# Patient Record
Sex: Female | Born: 1985 | Race: White | Hispanic: No | Marital: Married | Smoking: Never smoker
Health system: Southern US, Community
[De-identification: ages and names within clinical notes are randomized; demographics above are authoritative.]

## PROBLEM LIST (undated history)

## (undated) HISTORY — PX: TUBAL LIGATION: SHX77

## (undated) HISTORY — PX: LUNG TRANSPLANT: SHX234

---

## 2010-02-19 DIAGNOSIS — K219 Gastro-esophageal reflux disease without esophagitis: Secondary | ICD-10-CM | POA: Insufficient documentation

## 2011-05-29 DIAGNOSIS — E119 Type 2 diabetes mellitus without complications: Secondary | ICD-10-CM | POA: Insufficient documentation

## 2012-07-03 DIAGNOSIS — Z8719 Personal history of other diseases of the digestive system: Secondary | ICD-10-CM | POA: Insufficient documentation

## 2012-07-18 DIAGNOSIS — Z942 Lung transplant status: Secondary | ICD-10-CM | POA: Insufficient documentation

## 2012-07-25 DIAGNOSIS — F419 Anxiety disorder, unspecified: Secondary | ICD-10-CM | POA: Insufficient documentation

## 2012-08-18 DIAGNOSIS — E559 Vitamin D deficiency, unspecified: Secondary | ICD-10-CM | POA: Insufficient documentation

## 2015-03-30 DIAGNOSIS — M818 Other osteoporosis without current pathological fracture: Secondary | ICD-10-CM | POA: Insufficient documentation

## 2016-04-07 LAB — RAPID STREP TEST: Rapid Strep screen: NEGATIVE

## 2016-04-07 LAB — INFLUENZA SCREEN BY PCR
Influenza A by PCR: POSITIVE — AB
Influenza B by PCR: NEGATIVE

## 2016-04-07 NOTE — Other (Signed)
[facility]    [place], [place]                         REPORT TYPE:   Urgent Care Provider Report             REPORT STATUS: Signed    ________________________________________________________________________        PATIENT NAME:      Crystal Davidson,Crystal Davidson   PATIENT ID #:   [id number]    BIRTHDATE:           Mar 23, 1985   ACCOUNT #:     [id number]    AGE:           31      REG DATE:       04/07/16      GENDER:           F          LOC:          [facility]   Urgent Care Arrival: 04/07/16  0950h    ________________________________________________________________________        Lysle DingwallINTERESTED PARTIES:       Location:  [facility]               ATTENDING PHYSICIAN: Bunnie PionNEPTUNE,Martavion Couper APRN, FNP          ADDITIONAL COPIES:  Peterson AoSKELTON,SARAH K MD;SKELTON,SARAH K MD                                     ________________________________________________________________________    DICTABunnie Pion:  Torres Hardenbrook APRN, FNP        History of Present Illness    Date of Service:  Apr 07, 2016    Chief Complaint    sore throat, cough, fever    Travelled outside the [place]:  No    Patient displaying symptoms:  No    31 year old female [facility] with cough, fever, chills, sore throat   starting yesterday. She has history of CF with bilateral lung transplants in [facility] at [facility]. She   called transplant team this morning to discuss symptoms and was advised to present to urgent care   for influenza testing, strep screen, and CXR as she is immunosuppressed. Approx 1 week ago she   completed 3 week course of Levaquin for sinusitis, symptoms have improved. Highest fever was last   night at 101.6, cough is productive with green sputum. She denies ear pain, nausea, vomiting,   diarrhea. Denies headache. Admits to muscle aches, she also reports they have been present since   the end of her Levaquin course, improving. Denies chest pain or shortness of breath. Tylenol last   night for fever.        Review of Systems     Constitutional:  chills, fatigue, fever    Eyes:  no symptoms reported    Ears, Nose, Throat, Mouth:  no nose symptoms reported, sore throat, painful swallowing,  denies:   ear pain, facial pain    Respiratory:  cough, sputum    Cardiovascular:  denies: lightheadedness    Gastrointestinal:  denies: diarrhea, nausea, vomiting    Genitourinary:  no symptoms reported    Musculoskeletal:  muscle pain    Integumentary:  denies: rash    Neurologic:  denies: headache    ALLERGIES:      Coded Allergies:  cephapirin (Unverified  Allergy, Mild, HIVES, 04/07/16)    ------------------------------         HOME MEDICATIONS     Active     Reported    Sulfamethoxazole-Tmp Ss Tab (Sulfamethoxazole/Trimethoprim) 1 Each Tablet 1 Each PO     Prednisone 5 Mg Tablet 5 Mg PO     Aspirin 81 Mg Tablet.dr 81 Mg PO     Ibandronate Sodium 150 Mg Tablet 1 Tab PO MONTHLY         Take one tablet monthly    Sertraline Hcl 50 Mg Tablet 50 Mg PO DAILY    Folic Acid 1 Mg Tablet 1 Mg PO DAILY    Cellcept (Mycophenolate Mofetil) 250 Mg Capsule 250 Mg PO     Prograf (Tacrolimus Anhydrous) 1 Mg Capsule 1 Mg PO     Astagraf Xl (Tacrolimus) 5 Mg Cap.er.24h 5 Mg PO     Zenpep Dr 20,000 Units Capsule (Amylase/Lipase/Protease) 1 Each Capsule.dr 1 Each PO     Pantoprazole Sodium 40 Mg Tablet.dr 40 Mg PO DAILY    Levofloxacin 750 Mg Tablet 750 Mg PO     C[place] (Aztreonam Lysine) 75 Mg/1 Ml Vial.neb 75 Mg IH             Past Medical History    Medical History:  other (Cystic Fibrosis)    Surgical History:  other (bilateral lung transplant)        Physical Exam    VITAL SIGNS         Vital Signs              Date Time  Temp Pulse Resp B/P Pulse Ox O2 Delivery O2 Flow Rate FiO2         04/07/16 10:20 37.5 89 16 121/81 99 Room Air              Nursing Documentation Vitals:  reviewed by me    *    GENERAL: well-developed, well-nourished, in no distress    HEAD: atraumatic, normocephalic, non-tender frontal and maxillary sinuses on percussion     EYES: PERRLA, no eye discharge or injection    EARS: bilateral tympanic membranes pearly gray, non-bulging, normal light reflex    NOSE: nasal turbinates non-swollen, moist without redness or discharge    OROPHARYNX: buccal mucosa moist, oropharynx clear; posterior pharynx injected; no tonsillar swelling  , injection or exudates    NECK: supple, anterior cervical adenopathy, full ROM, non-tender, no stridor    RESPIRATORY: Inspiratory wheezes in left mid - upper. clear to auscultation over right. No rales or   rhonchi bilaterally; no accessory retraction or nasal flaring    CARDIOVASCULAR: regular rate. No leg edema    MS: Full ROM of shoulders, no weakness. Steady strong gait.     NEURO: CN II - CN XII grossly intact.        Labs/Rads/ECGs    Laboratory Results         Laboratory Tests            Test 04/07/16         10:50         Group A Streptococcus Rapid Negative                 Course    Medical Decision Making    Vital signs stable, patient nontoxic-appearing well-hydrated. Hx of CF with bilateral lung   transplant. She spoke with her transplant team at [facility] this morning and was advised to  be seen at   [facility] for CXR and Influenza testing. Patient with viral upper respiratory tract symptoms, no acute   process seen on CXR. Rapid Strep Screen negative, culture sent. Testing for influenza, will call   patient with positive or negative results, so she may notify her transplant team.      Patient without signs of bacterial sinusitis, otitis media, pharyngitis, or pneumonia on   examination. Inspiratory wheezing heard on left upon auscultation, discussed option for albuterol   inhaler with patient, she declined rx at this time as her transplant team is able to prescribe, she   will contact them for further management. She will follow up with PCP for muscle pain, specifically   right shoulder. Magic mouthwash rx for throat discomfort. Symptomatic care was reviewed. Reasons to    seek follow-up care including reasons to go to the ER were reviewed. The patient voiced their   understanding and agreement with this plan.        Discharge Plan    Clinical Impression:       Primary Impression:       Upper respiratory infection     Qualified Code:  J06.9 - Acute upper respiratory infection, unspecified    Referrals:      Dorthula Nettles MD (PCP)            Bunnie Pion APRN, FNP Apr 07, 2016 10:30        Electronically Signed By:   Janett Billow, FNP at 04/07/16 1302        Disclaimer: Portions of this document may contain text elements generated through computerized   voice recognition. Undetected computer generated word errors are possible. Please notify the   signing provider or return the document to [facility] Records department if clarification or   correction is needed.

## 2016-04-09 LAB — CULTURE, STREP THROAT: THROAT SCREEN BY CULTURE: NEGATIVE

## 2016-04-15 DIAGNOSIS — N182 Chronic kidney disease, stage 2 (mild): Secondary | ICD-10-CM | POA: Insufficient documentation

## 2016-06-04 ENCOUNTER — Encounter

## 2016-09-17 DIAGNOSIS — T86810 Lung transplant rejection: Secondary | ICD-10-CM | POA: Insufficient documentation

## 2016-11-27 ENCOUNTER — Emergency Department: Admit: 2016-11-27 | Payer: PRIVATE HEALTH INSURANCE | Primary: Family Medicine

## 2016-11-27 ENCOUNTER — Inpatient Hospital Stay
Admit: 2016-11-27 | Discharge: 2016-11-27 | Disposition: A | Discharge status: Home / Self Care | Payer: PRIVATE HEALTH INSURANCE | Attending: Emergency Medicine

## 2016-11-27 ENCOUNTER — Inpatient Hospital Stay: Admit: 2016-11-27 | Payer: MEDICARE | Attending: Physician Assistant | Primary: Family Medicine

## 2016-11-27 ENCOUNTER — Inpatient Hospital Stay
Admit: 2016-11-27 | Discharge: 2016-11-27 | Disposition: A | Discharge status: Other Acute Inpatient Hospital | Payer: MEDICARE | Attending: Physician Assistant

## 2016-11-27 DIAGNOSIS — J22 Unspecified acute lower respiratory infection: Secondary | ICD-10-CM

## 2016-11-27 DIAGNOSIS — R079 Chest pain, unspecified: Secondary | ICD-10-CM

## 2016-11-27 LAB — CBC WITH AUTOMATED DIFF
ABS. BASOPHILS: 0 10*3/uL (ref 0.00–0.08)
ABS. EOSINOPHILS: 0 10*3/uL (ref 0.0–0.4)
ABS. IMM. GRANS.: 0 10*3/uL (ref 0.00–0.04)
ABS. LYMPHOCYTES: 0.7 10*3/uL — ABNORMAL LOW (ref 1.2–3.7)
ABS. MONOCYTES: 0.6 10*3/uL (ref 0.2–1.0)
ABS. NEUTROPHILS: 9 10*3/uL — ABNORMAL HIGH (ref 1.6–6.1)
BASOPHILS: 0 % (ref 0–2)
EOSINOPHILS: 0 % (ref 0–5)
HCT: 34.3 % — ABNORMAL LOW (ref 37.0–47.0)
HGB: 11.5 g/dL — ABNORMAL LOW (ref 12.0–16.0)
IMMATURE GRANULOCYTES: 0 % (ref 0.0–0.6)
LYMPHOCYTES: 7 % — ABNORMAL LOW (ref 14–46)
MCH: 32.1 PG — ABNORMAL HIGH (ref 27.0–31.0)
MCHC: 33.5 g/dL (ref 33.0–37.0)
MCV: 95.8 FL (ref 81.0–99.0)
MONOCYTES: 6 % (ref 5–12)
MPV: 9.4 FL (ref 7.4–10.4)
NEUTROPHILS: 87 % — ABNORMAL HIGH (ref 47–80)
PLATELET: 286 10*3/uL (ref 130–400)
RBC: 3.58 M/uL — ABNORMAL LOW (ref 4.20–5.40)
RDW: 13.1 % (ref 11.5–14.5)
WBC: 10.3 10*3/uL (ref 4.5–10.9)

## 2016-11-27 LAB — METABOLIC PANEL, COMPREHENSIVE
ALT (SGPT): 18 U/L (ref 12–78)
AST (SGOT): 10 U/L (ref 10–37)
Albumin: 3.8 g/dL (ref 3.4–5.0)
Alk. phosphatase: 82 U/L (ref 43–117)
BUN: 20 MG/DL (ref 7–22)
Bilirubin, total: 0.8 mg/dL (ref 0.00–1.00)
CO2: 25 mmol/L (ref 21–32)
Calcium: 8.9 MG/DL (ref 8.5–10.1)
Chloride: 106 mmol/L (ref 98–108)
Creatinine: 0.88 MG/DL (ref 0.55–1.10)
GFR est AA: >60 mL/min/{1.73_m2} (ref >60)
GFR est non-AA: >60 mL/min/{1.73_m2} (ref >60)
Glucose: 130 mg/dL — ABNORMAL HIGH (ref 74–106)
Potassium: 4 mmol/L (ref 3.4–5.1)
Protein, total: 7.6 g/dL (ref 6.4–8.2)
Sodium: 139 mmol/L (ref 136–145)

## 2016-11-27 LAB — RESPIRATORY SYNCYTIAL VIRUS (RSV), PCR: RSV by PCR: NEGATIVE

## 2016-11-27 LAB — INFLUENZA A/B BY PCR
Influenza A by PCR: NEGATIVE
Influenza B by PCR: NEGATIVE

## 2016-11-27 MED ORDER — SODIUM CHLORIDE 0.9% BOLUS IV
0.9 % | Freq: Once | INTRAVENOUS | Status: DC
Start: 2016-11-27 — End: 2016-11-27

## 2016-11-27 MED ORDER — KETOROLAC TROMETHAMINE 30 MG/ML INJECTION
30 mg/mL (1 mL) | INTRAMUSCULAR | Status: AC
Start: 2016-11-27 — End: 2016-11-27
  Administered 2016-11-27: 19:00:00 via INTRAVENOUS

## 2016-11-27 MED ORDER — SODIUM CHLORIDE 0.9 % IV
1 gram | INTRAVENOUS | Status: AC
Start: 2016-11-27 — End: 2016-11-27
  Administered 2016-11-27: 20:00:00 via INTRAVENOUS

## 2016-11-27 MED FILL — SODIUM CHLORIDE 0.9 % IV: INTRAVENOUS | Qty: 1000

## 2016-11-27 MED FILL — MEROPENEM 1 GRAM IV SOLR: 1 gram | INTRAVENOUS | Qty: 2

## 2016-11-27 MED FILL — KETOROLAC TROMETHAMINE 30 MG/ML INJECTION: 30 mg/mL (1 mL) | INTRAMUSCULAR | Qty: 1

## 2016-11-27 NOTE — ED Triage Notes (Signed)
Pt sent from UC for further eval of SOB, and rt lower "lung pain". Pt is double lung transplant pt. States she [facility] last [facility], and has had pain and SOB since. Reports "minor cold symptoms" x 5 days, non prod cough and nasal congestion. Denies fevers/chills

## 2016-11-27 NOTE — Other (Signed)
This is a 31 year old woman with history of cystic fibrosis and doubled transplant four years ago here with dry cough for 5 days and acute right-sided sharp chest pain that started late last night.   She is being breathing in a shallow manner due to the chest pain however no tightness.  She reports this feels like when she has had pneumonia in the past.  She has been feeling well otherwise, has a mild runny nose but no fevers, no sore throat or other URI symptoms. She recently saw her lung transplant physician Dr. Sherren Mocha at [facility] in [facility], traveled by plane.  She is not on hormonal therapies, her tubes are tied however did have a blood clot when she was pregnant in the past felt to be secondary to pregnancy.  She took a couple Tylenol this morning with slightly took off the edge of the chest pain.             Past Medical History:   Diagnosis Date   ??? CF (cystic fibrosis) ([place])         Past Surgical History:   Procedure Laterality Date   ??? HX LUNG TRANSPLANT      Bilateral/double         History reviewed. No pertinent family history.     Social History     Socioeconomic History   ??? Marital status: MARRIED     Spouse name: Not on file   ??? Number of children: Not on file   ??? Years of education: Not on file   ??? Highest education level: Not on file   Social Needs   ??? Financial resource strain: Not on file   ??? Food insecurity - worry: Not on file   ??? Food insecurity - inability: Not on file   ??? Transportation needs - medical: Not on file   ??? Transportation needs - non-medical: Not on file   Occupational History   ??? Not on file   Tobacco Use   ??? Smoking status: Never Smoker   ??? Smokeless tobacco: Never Used   Substance and Sexual Activity   ??? Alcohol use: Not on file   ??? Drug use: Not on file   ??? Sexual activity: Not on file   Other Topics Concern   ??? Not on file   Social History Narrative   ??? Not on file                ALLERGIES: Cefepime    Review of Systems    Constitutional: Negative for activity change, appetite change, fatigue and fever.   HENT: Positive for rhinorrhea. Negative for ear pain, sinus pain and sore throat.    Eyes: Negative for discharge and redness.   Respiratory: Positive for cough. Negative for chest tightness and wheezing.    Cardiovascular: Positive for chest pain. Negative for leg swelling.   Gastrointestinal: Negative for abdominal pain, nausea and vomiting.   Genitourinary: Negative for difficulty urinating and dysuria.   Musculoskeletal: Negative for arthralgias and myalgias.   Neurological: Negative for weakness and headaches.       Vitals:    11/27/16 0957   BP: 119/73   Pulse: 88   Resp: 28   Temp: 98.6 ??F (37 ??C)   SpO2: 99%       Physical Exam    GENERAL: well-developed, well-nourished, in no acute distress  HEAD: atraumatic, normocephalic  EYES: no eye discharge or injection  EARS: bilateral tympanic membranes translucent with fluid,  mild Left TM bulging, normal light reflex, no tenderness with palpation of the neck below the ears bilaterally  NOSE: nasal turbinates swollen bilaterally, moist without redness with clear discharge, no facial erythema, warmth, or sinus tenderness bilaterally  OROPHARYNX: buccal mucosa moist, oropharynx clear; posterior pharyngeal arch symmetric with minimal erythema, midline uvula.  No signs of abscesses, no tonsillar swelling, exudates, or trismus  NECK: supple, full ROM, bilateral non-tender cervical adenopathy  HEART: regular rate and rhythm, no murmurs, regular radial pulse 2+  LUNGS:  No signs of respiratory distress, dry crackles auscultated on lateral and posterior right mid/lower lobes with no wheezing  ABDOMEN:   Normal bowel sounds, no abdominal aortic bruits, soft, nontender, no hepatosplenomegaly, normal abdominal aortic pulse  MSK:  No lower leg edema, no warmth, erythema or tenderness with palpation of lower legs bilaterally. Negative Homans sign bilaterally  NEURO:  Alert, oriented      MDM   Number of Diagnoses or Management Options  Acute chest pain:   Diagnosis management comments: 31 year old woman with history of cystic fibrosis status post double lung transplant 4 years ago with history of PE secondary to pregnancy and recent travel by plane with acute chest pain, lung crackles, and cough.  Differential includes pneumonia, PE, pneumothorax.  Discussed case with her transplant physician Dr. Whitman Hero at [facility].  Her chest x-ray here showed no signs of pneumonia, or pneumothorax as interpreted by myself and Doren Custard NP.  Her physician is concerned about the potential of a developing pneumothorax where she recently had a bronchoscopy on November 2nd, would like a noncontrast chest CT to fully rule this out.  She reports that the cultures from the bronchoscopy so far are negative however take up to 10 days to process.  She also recommended a nasal viral swab for influenza, parainfluenza, RSV, and human met a pneumo virus.  She gave her direct contact information to further speak with her upon receipt of the chest CT results.  I reviewed recommendation with patient, that she would need to go to the emergency room to have these performed and that we are not able to do these in the Urgent Care Clinic.  Patient showed understanding and agreement.  Called and spoke with Dr. Karl Pock in the ER to relay the plan and contact information for Dr. Sherren Mocha.    Patient reports that her chest pain and breathing is the same as last night, not worsening, I feel that the patient is stable to go by personal vehicle where the ER is less than 10 min away.  We reviewed that we could offer an ambulance transport for safety in case anything happened on the way, she declined.  Patient went directly to ER.    Advised on signs and symptoms warranting follow-up or emergency care. All patient's questions, concerns addressed and answered.  Patient agrees with the plan of care.           Amount and/or Complexity of Data Reviewed  Independent visualization of images, tracings, or specimens: yes               Procedures

## 2016-11-27 NOTE — ED Provider Notes (Signed)
Patient is a 31 year old female with a history of cystic fibrosis and bilateral lung transplant years ago performed by Dr. Tawanna Cooler at [facility] who presents now to the emergency department from the urgent care facility with a complaint of right lower rib pain since 11:00 p.m. last night.  The patient states that a little over 1 week ago she returned home from [facility] after having a bronchoscopy performed by her transplant doctor.  She denies any leg pain or swelling since that time.  Last night she was laying on the couch when she developed right lower rib pain. She thought initially that it was muscular discomfort and she went to bed.  When she got up this morning, however, the pain was persistent.  It is worsened with coughing, sneezing and deep inspiration.  She denies any productive cough or fever.  She has not had any nausea or vomiting. She states that her husband reminded her that the last time she had a similar pain she had pneumonia and so she went to urgent care for further evaluation.  They spoke with the patient's transplant surgeon who requested a noncontrast CT scan of the lungs to rule out pneumothorax and so the patient was sent to the emergency department.      The history is provided by the patient.   Shortness of Breath   Associated symptoms include chest pain (Right lower ribs). Pertinent negatives include no fever, no sore throat, no ear pain and no vomiting.        Past Medical History:   Diagnosis Date   ??? CF (cystic fibrosis) ([facility])        Past Surgical History:   Procedure Laterality Date   ??? HX LUNG TRANSPLANT      Bilateral/double         No family history on file.    Social History     Socioeconomic History   ??? Marital status: MARRIED     Spouse name: Not on file   ??? Number of children: Not on file   ??? Years of education: Not on file   ??? Highest education level: Not on file   Social Needs   ??? Financial resource strain: Not on file   ??? Food insecurity - worry: Not on file    ??? Food insecurity - inability: Not on file   ??? Transportation needs - medical: Not on file   ??? Transportation needs - non-medical: Not on file   Occupational History   ??? Not on file   Tobacco Use   ??? Smoking status: Never Smoker   ??? Smokeless tobacco: Never Used   Substance and Sexual Activity   ??? Alcohol use: Not on file   ??? Drug use: Not on file   ??? Sexual activity: Not on file   Other Topics Concern   ??? Not on file   Social History Narrative   ??? Not on file         ALLERGIES: Cefepime    Review of Systems   Constitutional: Negative for chills and fever.   HENT: Negative for congestion, ear pain and sore throat.    Eyes: Negative.    Respiratory: Positive for shortness of breath.    Cardiovascular: Positive for chest pain (Right lower ribs).   Gastrointestinal: Negative for nausea and vomiting.   Genitourinary: Negative.    Musculoskeletal: Negative for back pain.   Skin: Negative.    Neurological: Negative.    All other systems reviewed and  are negative.      Vitals:    11/27/16 1122   BP: 115/72   Pulse: 76   Resp: 24   Temp: 98.4 ??F (36.9 ??C)   SpO2: 99%   Weight: 51.7 kg (114 lb)   Height: 5' 6.5" (1.689 m)            Physical Exam   Nursing note and vitals reviewed.  General: awake and alert, no acute distress, sitting upright on the stretcher  Head: normocephalic, atraumatic  Neck:  Supple, painless range of motion, no cervical lymphadenopathy, multiple scars over the anterior inferior neck related to prior surgeries  HEENT: PERRL bilaterally, no conjunctival pallor, moist mucous membranes, no pharyngeal erythema or exudates or tonsillar swelling, no oral lesions  Cardiovascular: heart regular rate and rhythm without murmurs, rubs or gallops, equal bilateral radial and DP pulses  Chest wall:  No reproducible chest wall tenderness  Pulmonary: lungs with coarse crackles in the right base, no respiratory distress, no wheezing or stridor  Abdomen: soft, nontender, nondistended, normoactive bowel sounds   Extremities: no lower extremity edema, no joint swelling, no bilateral calf swelling or tenderness to palpation  Skin: pink, warm, dry, no rashes  Neuro: alert and oriented, moves all extremities equally and spontaneously, ambulates without ataxia or need for assistance, no focal neurologic deficits         MDM  Number of Diagnoses or Management Options  Acute lower respiratory infection:   Diagnosis management comments: This is a 31 year old female with a complicated history of cystic fibrosis and bilateral lung transplant who presents now to the emergency department from urgent care with a complaint of right lower rib pain since last night.  The pain is pleuritic.  It is not reproducible on my exam.  She does have coarse crackles in the right base.  At Urgent Care she was sent for chest x-ray which did not show evidence of infiltrate or pneumothorax.  The provider at Urgent Care spoke with the patient's transplant surgeon, Dr. Tawanna Coolerodd, at [facility] who requested that the patient get a noncontrast CT scan to rule out occult pneumothorax in the setting of the patient's recent bronchoscopy.  She also requested that the patient be tested with viral swabs for influenza, parainfluenza, RSV and human metapneumovirus.  We do not have a viral swabs for all of these organisms but I did order swabs for influenza and RSV as well as a viral respiratory culture to help assess this.  I ordered a noncontrast CT scan of the chest as requested by Dr. Tawanna Coolerodd.  I also ordered a CBC and metabolic panel. She was given 15mg  IV tramadol through the chronic PICC line in her right upper arm for pain.    ED Course as of Nov 28 1422   Thu Nov 27, 2016   1300 The patient's RSV and influenza swabs are negative. There is no leukocytosis.  She is not acutely anemic.  Metabolic panel is normal. CT scan results are pending.  [JF]   1306 Dr. Nelida Meuseodd's phone number is [phone]) [phone].  [JF]    1416 I discussed the patient's CT findings with her transplant physician, Dr. Tawanna Coolerodd.  There is evidence of biapical disease worse on the right than on the left but no evidence of pneumonia or pneumothorax.  The patient has a very low pretest probability for pulmonary embolism and this diagnosis is considered unlikely.  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  She was given large doses of anti-rejection medications on her last visit to [facility] just last week.  For this reason she is currently immunosuppressed.  She requested that I give the patient 2 g of intravenous meropenem in the emergency department here through her PICC line and she will make arrangements for the patient to receive intravenous meropenem as an outpatient.  The patient has previously responded well to this medication for her Pseudomonas which has grown out in prior cultures.  The patient reports feeling comfortable with the plan for discharge. She is not hypoxic.  There are no signs or symptoms of sepsis.  She will be going home.  [JF]   1423 It should also be noted that Dr. Tawanna Coolerodd would like to be notified if the patient's viral cultures are positive.  Have included Dr. Nelida Meuseodd's phone number in this note to assist with outpatient follow-up.  [JF]      ED Course User Index  [JF] Jearl KlinefelterFlint, Tranae Laramie M, MD       Procedures    Noncontrast CT scan of the chest as interpreted by Radiology:  1. Progressive biapical disease right much worse than left. The patient carries a diagnosis of chronic mycobacterial disease and these findings could represent progression of that disease process  2. Interlobular septal thickening, finding that typically signifies intravascular volume overload, diagnosis that would be unusual in a patient this age.

## 2016-11-27 NOTE — Other (Signed)
Patient has had cough for five days. Patient has right sided lung pain since 11pm last night. Patient has hx of cystic fibrosis and has had a double lung transplant.

## 2017-03-05 DIAGNOSIS — E440 Moderate protein-calorie malnutrition: Secondary | ICD-10-CM | POA: Insufficient documentation

## 2017-05-24 DIAGNOSIS — I724 Aneurysm of artery of lower extremity: Secondary | ICD-10-CM | POA: Insufficient documentation

## 2017-10-27 DIAGNOSIS — Z942 Lung transplant status: Secondary | ICD-10-CM | POA: Diagnosis not present

## 2017-10-27 DIAGNOSIS — Z418 Encounter for other procedures for purposes other than remedying health state: Secondary | ICD-10-CM | POA: Diagnosis not present

## 2017-10-27 DIAGNOSIS — T86819 Unspecified complication of lung transplant: Secondary | ICD-10-CM | POA: Diagnosis not present

## 2017-11-24 DIAGNOSIS — Z942 Lung transplant status: Secondary | ICD-10-CM | POA: Diagnosis not present

## 2017-11-24 DIAGNOSIS — R918 Other nonspecific abnormal finding of lung field: Secondary | ICD-10-CM | POA: Diagnosis not present

## 2017-11-24 DIAGNOSIS — B258 Other cytomegaloviral diseases: Secondary | ICD-10-CM | POA: Diagnosis not present

## 2017-11-24 DIAGNOSIS — D899 Disorder involving the immune mechanism, unspecified: Secondary | ICD-10-CM | POA: Diagnosis not present

## 2017-11-24 DIAGNOSIS — Z23 Encounter for immunization: Secondary | ICD-10-CM | POA: Diagnosis not present

## 2017-11-24 DIAGNOSIS — Z4824 Encounter for aftercare following lung transplant: Secondary | ICD-10-CM | POA: Diagnosis not present

## 2017-12-14 DIAGNOSIS — E785 Hyperlipidemia, unspecified: Secondary | ICD-10-CM | POA: Diagnosis not present

## 2017-12-14 DIAGNOSIS — K219 Gastro-esophageal reflux disease without esophagitis: Secondary | ICD-10-CM | POA: Diagnosis not present

## 2017-12-14 DIAGNOSIS — Z942 Lung transplant status: Secondary | ICD-10-CM | POA: Diagnosis not present

## 2017-12-14 DIAGNOSIS — T86818 Other complications of lung transplant: Secondary | ICD-10-CM | POA: Diagnosis not present

## 2017-12-14 DIAGNOSIS — Z4824 Encounter for aftercare following lung transplant: Secondary | ICD-10-CM | POA: Diagnosis not present

## 2018-01-29 DIAGNOSIS — M810 Age-related osteoporosis without current pathological fracture: Secondary | ICD-10-CM | POA: Diagnosis not present

## 2018-01-29 DIAGNOSIS — Z942 Lung transplant status: Secondary | ICD-10-CM | POA: Diagnosis not present

## 2018-01-29 DIAGNOSIS — Z4824 Encounter for aftercare following lung transplant: Secondary | ICD-10-CM | POA: Diagnosis not present

## 2018-01-29 DIAGNOSIS — D899 Disorder involving the immune mechanism, unspecified: Secondary | ICD-10-CM | POA: Diagnosis not present

## 2018-01-29 DIAGNOSIS — K219 Gastro-esophageal reflux disease without esophagitis: Secondary | ICD-10-CM | POA: Diagnosis not present

## 2018-01-29 DIAGNOSIS — R05 Cough: Secondary | ICD-10-CM | POA: Diagnosis not present

## 2018-01-29 DIAGNOSIS — T86818 Other complications of lung transplant: Secondary | ICD-10-CM | POA: Diagnosis not present

## 2018-02-17 ENCOUNTER — Encounter: Payer: Self-pay | Admitting: Family

## 2018-02-17 ENCOUNTER — Ambulatory Visit (INDEPENDENT_AMBULATORY_CARE_PROVIDER_SITE_OTHER): Payer: 59 | Admitting: Family

## 2018-02-17 VITALS — BP 108/70 | HR 77 | Temp 98.2°F | Ht 66.0 in | Wt 117.0 lb

## 2018-02-17 DIAGNOSIS — B078 Other viral warts: Secondary | ICD-10-CM

## 2018-02-17 DIAGNOSIS — Z942 Lung transplant status: Secondary | ICD-10-CM

## 2018-02-17 DIAGNOSIS — B379 Candidiasis, unspecified: Secondary | ICD-10-CM

## 2018-02-17 MED ORDER — FLUCONAZOLE 150 MG PO TABS: 150.0000 mg | ORAL_TABLET | Freq: Once | ORAL | 0 refills | Status: AC

## 2018-02-17 NOTE — Progress Notes (Signed)
Stacy Oconnor is a 33 y.o. female with the following history as recorded in EpicCare:  Patient Active Problem List   Diagnosis Date Noted  . 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 Moderate protein-calorie malnutrition ([place]) 03/05/2017  . Chronic rejection of lung transplant ([place]) 09/17/2016  . CKD (chronic kidney disease) stage 2, GFR 60-89 ml/min 04/15/2016  . 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 Vitamin D deficiency, unspecified 08/18/2012  . Immunosuppression ([place]) 08/06/2012  . Anxiety 07/25/2012  . Lung transplant status ([place]) 07/18/2012  . History of cholelithiasis 07/03/2012  . Cystic fibrosis of the lung ([place]) 06/22/2012  . Diabetes mellitus ([place]) 05/29/2011  . Gastro-esophageal reflux disease without esophagitis 02/19/2010    Current Outpatient Medications  Medication Sig Dispense Refill  . Continuous Blood Gluc Receiver (FREESTYLE LIBRE 14 DAY READER) DEVI Inject into the skin.    Marland Kitchen CREON 24000-76000 units CPEP TAKE 4-5 CAPSULES WITH MEALS AND 2-3 WITH SNACKS    . CVS D3 125 MCG (5000 UT) capsule TAKE 4 CAPSULES (20,000 UNITS TOTAL) BY MOUTH ONCE DAILY    . gabapentin (NEURONTIN) 100 MG capsule TAKE 2 TABS IN THE MORNING AND 3 TABS IN THE EVENING. YOU CAN DECREASE AS TOLERATED EVERY TWO WEEKS.    . glucose blood (PRECISION QID TEST) test strip Use 4 (four) times daily as needed Use as instructed.    . ibandronate (BONIVA) 150 MG tablet Take by mouth.    . Isavuconazonium Sulfate 186 MG CAPS Take by mouth.    Marland Kitchen MAGNESIUM GLUCONATE PO Take 400 mg by mouth.    . mycophenolate (CELLCEPT) 250 MG capsule TAKE 4 CAPSULES (1,000 MG TOTAL) BY MOUTH 2 (TWO) TIMES DAILY    . ondansetron (ZOFRAN) 4 MG tablet Take by mouth.    . pantoprazole (PROTONIX) 40 MG tablet Take by mouth.    . predniSONE (DELTASONE) 5 MG tablet TAKE 2 TABLETS (10 MG TOTAL) BY MOUTH ONCE DAILY    . sertraline (ZOLOFT) 50 MG tablet Take 50 mg by mouth daily.    . tacrolimus  (PROGRAF) 5 MG capsule TAKE 2 CAPSULES (10 MG TOTAL) BY MOUTH 2 (TWO) TIMES DAILY    . fluconazole (DIFLUCAN) 150 MG tablet Take 1 tablet (150 mg total) by mouth once for 1 dose. Repeat after 72 hours 2 tablet 0   No current facility-administered medications for this visit.     Allergies: Cefepime; Mirtazapine; Nsaids; Prochlorperazine; and Vancomycin  History reviewed. No pertinent past medical history.  History reviewed. No pertinent surgical history.  History reviewed. No pertinent family history.   Patient has extensive medical and surgical history- they are reviewed in the Care Everywhere section through [facility];   Social History   Tobacco Use  . Smoking status: Never Smoker  . Smokeless tobacco: Never Used  Substance Use Topics  . Alcohol use: Not on file    Subjective:  Patient presents today as a new patient; very complicated medical history- has Cystic Fibrosis- has had 2 lung transplants/ under care of pulmonology at [facility]; has CF related diabetes- managing through diet control/ sees endocrinology at [facility] for management. Has recently completed round of antibiotics per pulmonology- has developed yeast infection; requesting treatment with oral Diflucan; is up to date on pap smear-2019; Also requesting referral to dermatology- needs yearly skin check with history of transplant/ concerned about warts on hands;    Objective:  Vitals:   02/17/18 1043  BP: 108/70  Pulse: 77  Temp: 98.2 F (  36.8 C)  TempSrc: Oral  SpO2: 99%  Weight: 117 lb (53.1 kg)  Height: 5\' 6"  (1.676 m)    General: Well developed, well nourished, in no acute distress  Skin : Warm and dry. Common warts noted on left hand Head: Normocephalic and atraumatic  Eyes: Sclera and conjunctiva clear; pupils round and reactive to light; extraocular movements intact  Ears: External normal; canals clear; tympanic membranes normal  Oropharynx: Pink, supple. No suspicious lesions  Neck: Supple without thyromegaly,  adenopathy  Lungs: Respirations unlabored;  Neurologic: Alert and oriented; speech intact; face symmetrical; moves all extremities well; CNII-XII intact without focal deficit   Assessment:  1. Lung transplant status (Stacy Oconnor)   2. Yeast infection   3. Other viral warts     Plan:  1. Majority of healthcare needs are managed by [facility]- she will continue regular follow-up there; She is to check on necessary frequency of pap smear and follow-up as needed; 2. Rx for Diflucan; 3. Refer to dermatology.   No follow-ups on file.  Orders Placed This Encounter  Procedures  . Ambulatory referral to Dermatology    Referral Priority:   Routine    Referral Type:   Consultation    Referral Reason:   Specialty Services Required    Requested Specialty:   Dermatology    Number of Visits Requested:   1    Requested Prescriptions   Signed Prescriptions Disp Refills  . fluconazole (DIFLUCAN) 150 MG tablet 2 tablet 0    Sig: Take 1 tablet (150 mg total) by mouth once for 1 dose. Repeat after 72 hours

## 2018-03-05 DIAGNOSIS — K8689 Other specified diseases of pancreas: Secondary | ICD-10-CM | POA: Diagnosis not present

## 2018-03-05 DIAGNOSIS — Z01818 Encounter for other preprocedural examination: Secondary | ICD-10-CM | POA: Diagnosis not present

## 2018-03-09 DIAGNOSIS — D899 Disorder involving the immune mechanism, unspecified: Secondary | ICD-10-CM | POA: Diagnosis not present

## 2018-03-09 DIAGNOSIS — T86818 Other complications of lung transplant: Secondary | ICD-10-CM | POA: Diagnosis not present

## 2018-03-09 DIAGNOSIS — R05 Cough: Secondary | ICD-10-CM | POA: Diagnosis not present

## 2018-03-09 DIAGNOSIS — R918 Other nonspecific abnormal finding of lung field: Secondary | ICD-10-CM | POA: Diagnosis not present

## 2018-03-09 DIAGNOSIS — Z4824 Encounter for aftercare following lung transplant: Secondary | ICD-10-CM | POA: Diagnosis not present

## 2018-03-09 DIAGNOSIS — Z942 Lung transplant status: Secondary | ICD-10-CM | POA: Diagnosis not present

## 2018-03-09 DIAGNOSIS — Z5181 Encounter for therapeutic drug level monitoring: Secondary | ICD-10-CM | POA: Diagnosis not present

## 2018-03-09 DIAGNOSIS — Z79899 Other long term (current) drug therapy: Secondary | ICD-10-CM | POA: Diagnosis not present

## 2018-03-31 ENCOUNTER — Other Ambulatory Visit: Payer: Self-pay

## 2018-03-31 ENCOUNTER — Ambulatory Visit (INDEPENDENT_AMBULATORY_CARE_PROVIDER_SITE_OTHER): Payer: 59 | Admitting: Internal Medicine

## 2018-03-31 ENCOUNTER — Encounter: Payer: Self-pay | Admitting: Internal Medicine

## 2018-03-31 VITALS — BP 102/64 | HR 88 | Temp 98.4°F | Ht 66.0 in | Wt 114.0 lb

## 2018-03-31 DIAGNOSIS — R6889 Other general symptoms and signs: Secondary | ICD-10-CM

## 2018-03-31 DIAGNOSIS — R05 Cough: Secondary | ICD-10-CM | POA: Diagnosis not present

## 2018-03-31 DIAGNOSIS — R059 Cough, unspecified: Secondary | ICD-10-CM

## 2018-03-31 LAB — POCT INFLUENZA A/B
Influenza A, POC: NEGATIVE
Influenza B, POC: NEGATIVE

## 2018-03-31 NOTE — Progress Notes (Signed)
Subjective:    Patient ID: Stacy Oconnor, female    DOB: 11/25/1985, 33 y.o.   MRN: [id number]  HPI  Here to f/u after urged by the lung transplant provider to seek flu testing after 3 days with acute onset , general weakness and malaise, with prod cough ? greenish sputum, but Pt denies chest pain, increased sob or doe, wheezing, orthopnea, PND, increased LE swelling, palpitations, dizziness or syncope.  Has been tx with empiric levaquin, and asking for rapid flu testing today. Pt denies new neurological symptoms such as new headache, or facial or extremity weakness or numbness   Pt denies polydipsia, polyuria No past medical history on file. No past surgical history on file.  reports that she has never smoked. She has never used smokeless tobacco. No history on file for alcohol and drug. family history is not on file. Allergies  Allergen Reactions  . Cefepime Cough, Hives, Itching, Nausea Only and Rash    per patient - tolerates ceftazidime rash per patient - tolerates ceftazidime per patient - tolerates ceftazidime rash Rash, prior to transplant a few years ago    . Mirtazapine     Other reaction(s): Muscle Pain Reports muscle pain in lower extremities and back pain, Mg level was also low during this time however patient would not like to take this medication again.   . Nsaids     Other reaction(s): Other (See Comments) Due to lung transplant and use of prograf, higher risk of kidney failure  . Prochlorperazine     Other reaction(s): Other (See Comments) somnolence  . Vancomycin     Other reaction(s): Red Man Syndrome "I need it to run at a slower rate."   Current Outpatient Medications on File Prior to Visit  Medication Sig Dispense Refill  . Continuous Blood Gluc Receiver (FREESTYLE LIBRE 14 DAY READER) DEVI Inject into the skin.    Marland Kitchen CREON 24000-76000 units CPEP TAKE 4-5 CAPSULES WITH MEALS AND 2-3 WITH SNACKS    . CVS D3 125 MCG (5000 UT) capsule TAKE 4 CAPSULES (20,000  UNITS TOTAL) BY MOUTH ONCE DAILY    . gabapentin (NEURONTIN) 100 MG capsule TAKE 2 TABS IN THE MORNING AND 3 TABS IN THE EVENING. YOU CAN DECREASE AS TOLERATED EVERY TWO WEEKS.    . glucose blood (PRECISION QID TEST) test strip Use 4 (four) times daily as needed Use as instructed.    . ibandronate (BONIVA) 150 MG tablet Take by mouth.    . Isavuconazonium Sulfate 186 MG CAPS Take by mouth.    Marland Kitchen MAGNESIUM GLUCONATE PO Take 400 mg by mouth.    . mycophenolate (CELLCEPT) 250 MG capsule TAKE 4 CAPSULES (1,000 MG TOTAL) BY MOUTH 2 (TWO) TIMES DAILY    . ondansetron (ZOFRAN) 4 MG tablet Take by mouth.    . pantoprazole (PROTONIX) 40 MG tablet Take by mouth.    . predniSONE (DELTASONE) 5 MG tablet TAKE 2 TABLETS (10 MG TOTAL) BY MOUTH ONCE DAILY    . sertraline (ZOLOFT) 50 MG tablet Take 50 mg by mouth daily.    . tacrolimus (PROGRAF) 5 MG capsule TAKE 2 CAPSULES (10 MG TOTAL) BY MOUTH 2 (TWO) TIMES DAILY     No current facility-administered medications on file prior to visit.    Review of Systems  All otherwise neg per pt    Objective:   Physical Exam BP 102/64   Pulse 88   Temp 98.4 F (36.9 C) (Oral)   Ht 5\' 6"  (1.676  m)   Wt 114 lb (51.7 kg)   SpO2 98%   BMI 18.40 kg/m  VS noted, mild ill Constitutional: Pt appears in NAD HENT: Head: NCAT.  Right Ear: External ear normal.  Left Ear: External ear normal.  Eyes: . Pupils are equal, round, and reactive to light. Conjunctivae and EOM are normal Left tm's with mild erythema.  Max sinus areas non tender.  Pharynx with mild erythema, no exudate Nose: without d/c or deformity Neck: Neck supple. Gross normal ROM Cardiovascular: Normal rate and regular rhythm.   Pulmonary/Chest: Effort normal and breath sounds somewhat decreased without rales or wheezing.  Neurological: Pt is alert. At baseline orientation, motor grossly intact Skin: Skin is warm. No rashes, other new lesions, no LE edema Psychiatric: Pt behavior is normal without  agitation  No other exam findings  POCT Influenza A/B  Order: [id number]  Status:  Final result Visible to patient:  No (Not Released) Dx:  Flu-like symptoms   Ref Range & Units 15:06  Influenza A, POC Negative Negative   Influenza B, POC Negative Negative            Assessment & Plan:

## 2018-03-31 NOTE — Patient Instructions (Signed)
Your Flu test was negative  Please continue the levaquin  Please continue all other medications as before, and refills have been done if requested.  Please have the pharmacy call with any other refills you may need.  Please keep your appointments with your specialists as you may have planned

## 2018-03-31 NOTE — Assessment & Plan Note (Signed)
Rapid flu negative, pt reassured, ok to continue the levaquin as she has,  to f/u any worsening symptoms or concerns

## 2018-04-20 DIAGNOSIS — Z5181 Encounter for therapeutic drug level monitoring: Secondary | ICD-10-CM | POA: Diagnosis not present

## 2018-04-20 DIAGNOSIS — Z942 Lung transplant status: Secondary | ICD-10-CM | POA: Diagnosis not present

## 2018-04-20 DIAGNOSIS — Z48298 Encounter for aftercare following other organ transplant: Secondary | ICD-10-CM | POA: Diagnosis not present

## 2018-04-20 DIAGNOSIS — Z4824 Encounter for aftercare following lung transplant: Secondary | ICD-10-CM | POA: Diagnosis not present

## 2018-04-20 DIAGNOSIS — D61818 Other pancytopenia: Secondary | ICD-10-CM | POA: Diagnosis not present

## 2018-04-20 DIAGNOSIS — R071 Chest pain on breathing: Secondary | ICD-10-CM | POA: Diagnosis not present

## 2018-04-20 DIAGNOSIS — J329 Chronic sinusitis, unspecified: Secondary | ICD-10-CM | POA: Diagnosis not present

## 2018-04-20 DIAGNOSIS — T86818 Other complications of lung transplant: Secondary | ICD-10-CM | POA: Diagnosis not present

## 2018-05-04 DIAGNOSIS — B078 Other viral warts: Secondary | ICD-10-CM | POA: Diagnosis not present

## 2018-05-04 DIAGNOSIS — R208 Other disturbances of skin sensation: Secondary | ICD-10-CM | POA: Diagnosis not present

## 2018-05-04 DIAGNOSIS — Z418 Encounter for other procedures for purposes other than remedying health state: Secondary | ICD-10-CM | POA: Diagnosis not present

## 2018-05-04 DIAGNOSIS — T86819 Unspecified complication of lung transplant: Secondary | ICD-10-CM | POA: Diagnosis not present

## 2018-05-04 DIAGNOSIS — R234 Changes in skin texture: Secondary | ICD-10-CM | POA: Diagnosis not present

## 2018-05-04 DIAGNOSIS — Z942 Lung transplant status: Secondary | ICD-10-CM | POA: Diagnosis not present

## 2018-05-12 DIAGNOSIS — T86819 Unspecified complication of lung transplant: Secondary | ICD-10-CM | POA: Diagnosis not present

## 2018-05-12 DIAGNOSIS — Z942 Lung transplant status: Secondary | ICD-10-CM | POA: Diagnosis not present

## 2018-05-12 DIAGNOSIS — Z418 Encounter for other procedures for purposes other than remedying health state: Secondary | ICD-10-CM | POA: Diagnosis not present

## 2018-05-17 DIAGNOSIS — T86818 Other complications of lung transplant: Secondary | ICD-10-CM | POA: Diagnosis not present

## 2018-05-17 DIAGNOSIS — Z4824 Encounter for aftercare following lung transplant: Secondary | ICD-10-CM | POA: Diagnosis not present

## 2018-05-17 DIAGNOSIS — R109 Unspecified abdominal pain: Secondary | ICD-10-CM | POA: Diagnosis not present

## 2018-05-17 DIAGNOSIS — Z87442 Personal history of urinary calculi: Secondary | ICD-10-CM | POA: Diagnosis not present

## 2018-05-24 DIAGNOSIS — Z942 Lung transplant status: Secondary | ICD-10-CM | POA: Diagnosis not present

## 2018-05-24 DIAGNOSIS — T86819 Unspecified complication of lung transplant: Secondary | ICD-10-CM | POA: Diagnosis not present

## 2018-05-24 DIAGNOSIS — Z418 Encounter for other procedures for purposes other than remedying health state: Secondary | ICD-10-CM | POA: Diagnosis not present

## 2018-06-07 DIAGNOSIS — Z5181 Encounter for therapeutic drug level monitoring: Secondary | ICD-10-CM | POA: Diagnosis not present

## 2018-06-07 DIAGNOSIS — J329 Chronic sinusitis, unspecified: Secondary | ICD-10-CM | POA: Diagnosis not present

## 2018-06-07 DIAGNOSIS — R918 Other nonspecific abnormal finding of lung field: Secondary | ICD-10-CM | POA: Diagnosis not present

## 2018-06-07 DIAGNOSIS — H9313 Tinnitus, bilateral: Secondary | ICD-10-CM | POA: Diagnosis not present

## 2018-06-07 DIAGNOSIS — J9811 Atelectasis: Secondary | ICD-10-CM | POA: Diagnosis not present

## 2018-06-07 DIAGNOSIS — T86818 Other complications of lung transplant: Secondary | ICD-10-CM | POA: Diagnosis not present

## 2018-06-07 DIAGNOSIS — H9191 Unspecified hearing loss, right ear: Secondary | ICD-10-CM | POA: Diagnosis not present

## 2018-06-07 DIAGNOSIS — J324 Chronic pansinusitis: Secondary | ICD-10-CM | POA: Diagnosis not present

## 2018-06-07 DIAGNOSIS — Z4824 Encounter for aftercare following lung transplant: Secondary | ICD-10-CM | POA: Diagnosis not present

## 2018-06-07 DIAGNOSIS — Z942 Lung transplant status: Secondary | ICD-10-CM | POA: Diagnosis not present

## 2018-06-16 DIAGNOSIS — L821 Other seborrheic keratosis: Secondary | ICD-10-CM | POA: Diagnosis not present

## 2018-06-16 DIAGNOSIS — D225 Melanocytic nevi of trunk: Secondary | ICD-10-CM | POA: Diagnosis not present

## 2018-06-16 DIAGNOSIS — B078 Other viral warts: Secondary | ICD-10-CM | POA: Diagnosis not present

## 2018-06-16 DIAGNOSIS — Z1283 Encounter for screening for malignant neoplasm of skin: Secondary | ICD-10-CM | POA: Diagnosis not present

## 2018-08-02 ENCOUNTER — Telehealth: Payer: Self-pay | Admitting: Family

## 2018-08-02 NOTE — Telephone Encounter (Signed)
I am sorry I don't understand this message. Are they asking for marriage counseling? I don't know what group therapy is? If they need counseling, they can schedule their own appointment with [facility]. Just go to website for [place] and request appointment.

## 2018-08-02 NOTE — Telephone Encounter (Signed)
Spoke with patient and info given 

## 2018-08-02 NOTE — Telephone Encounter (Signed)
Patient requesting referral for group therapy between patient and spouse (spouse is also a patient Stacy Oconnor  of PCP) please advise when referral is placed.

## 2018-08-11 ENCOUNTER — Other Ambulatory Visit (HOSPITAL_COMMUNITY)
Admission: RE | Admit: 2018-08-11 | Discharge: 2018-08-11 | Disposition: A | Discharge status: Home / Self Care | Payer: 59 | Source: Ambulatory Visit | Attending: Family | Admitting: Family

## 2018-08-11 ENCOUNTER — Other Ambulatory Visit: Payer: Medicare Other

## 2018-08-11 ENCOUNTER — Encounter: Payer: Self-pay | Admitting: Family

## 2018-08-11 ENCOUNTER — Other Ambulatory Visit: Payer: Self-pay | Admitting: Family

## 2018-08-11 ENCOUNTER — Ambulatory Visit (INDEPENDENT_AMBULATORY_CARE_PROVIDER_SITE_OTHER): Payer: 59 | Admitting: Family

## 2018-08-11 ENCOUNTER — Other Ambulatory Visit: Payer: Self-pay

## 2018-08-11 VITALS — BP 106/68 | HR 71 | Temp 98.1°F | Ht 66.0 in | Wt 111.6 lb

## 2018-08-11 DIAGNOSIS — N926 Irregular menstruation, unspecified: Secondary | ICD-10-CM | POA: Diagnosis not present

## 2018-08-11 DIAGNOSIS — R102 Pelvic and perineal pain: Secondary | ICD-10-CM | POA: Diagnosis not present

## 2018-08-11 DIAGNOSIS — N898 Other specified noninflammatory disorders of vagina: Secondary | ICD-10-CM

## 2018-08-11 DIAGNOSIS — Z1151 Encounter for screening for human papillomavirus (HPV): Secondary | ICD-10-CM | POA: Diagnosis not present

## 2018-08-11 LAB — WET PREP, GENITAL
Trich, Wet Prep: NONE SEEN — AB
Yeast Wet Prep HPF POC: NONE SEEN — AB

## 2018-08-11 MED ORDER — FLUCONAZOLE 150 MG PO TABS: 150.0000 mg | ORAL_TABLET | Freq: Once | ORAL | 0 refills | Status: AC

## 2018-08-11 MED ORDER — METRONIDAZOLE 500 MG PO TABS: 500.0000 mg | ORAL_TABLET | Freq: Two times a day (BID) | ORAL | 0 refills | Status: AC

## 2018-08-11 NOTE — Progress Notes (Signed)
Stacy Oconnor is a 33 y.o. female with the following history as recorded in EpicCare:  Patient Active Problem List   Diagnosis Date Noted  . Cough 03/31/2018  . 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 Moderate protein-calorie malnutrition ([place]) 03/05/2017  . 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 CKD (chronic kidney disease) stage 2, GFR 60-89 ml/min 04/15/2016  . 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 Vitamin D deficiency, unspecified 08/18/2012  . Immunosuppression ([place]) 08/06/2012  . Anxiety 07/25/2012  . Lung transplant status ([place]) 07/18/2012  . History of cholelithiasis 07/03/2012  . Cystic fibrosis of the lung ([place]) 06/22/2012  . Diabetes mellitus ([place]) 05/29/2011  . Gastro-esophageal reflux disease without esophagitis 02/19/2010    Current Outpatient Medications  Medication Sig Dispense Refill  . albuterol (VENTOLIN HFA) 108 (90 Base) MCG/ACT inhaler INHALE 2 INHALATIONS INTO THE LUNGS EVERY 4 (FOUR) HOURS AS NEEDED FOR WHEEZING    . Assure Comfort Lancets 28G MISC Inject into the skin.    . Continuous Blood Gluc Receiver (FREESTYLE LIBRE 14 DAY READER) DEVI Inject into the skin.    Marland Kitchen CREON 24000-76000 units CPEP TAKE 4-5 CAPSULES WITH MEALS AND 2-3 WITH SNACKS    . CVS D3 125 MCG (5000 UT) capsule TAKE 4 CAPSULES (20,000 UNITS TOTAL) BY MOUTH ONCE DAILY    . gabapentin (NEURONTIN) 100 MG capsule TAKE 2 TABS IN THE MORNING AND 3 TABS IN THE EVENING. YOU CAN DECREASE AS TOLERATED EVERY TWO WEEKS.    . glucose blood (PRECISION QID TEST) test strip Use 4 (four) times daily as needed Use as instructed.    . ibandronate (BONIVA) 150 MG tablet Take by mouth.    Marland Kitchen MAGNESIUM GLUCONATE PO Take 400 mg by mouth.    . mycophenolate (CELLCEPT) 250 MG capsule TAKE 4 CAPSULES (1,000 MG TOTAL) BY MOUTH 2 (TWO) TIMES DAILY    . ondansetron (ZOFRAN) 4 MG tablet Take by mouth.    . pantoprazole (PROTONIX) 40 MG tablet Take by mouth.    .  predniSONE (DELTASONE) 5 MG tablet TAKE 2 TABLETS (10 MG TOTAL) BY MOUTH ONCE DAILY    . sertraline (ZOLOFT) 50 MG tablet Take 50 mg by mouth daily.    Marland Kitchen sulfamethoxazole-trimethoprim (BACTRIM) 400-80 MG tablet TAKE 1 TABLET (80 MG OF TRIMETHOPRIM TOTAL) BY MOUTH ONCE DAILY    . tacrolimus (PROGRAF) 5 MG capsule TAKE 2 CAPSULES (10 MG TOTAL) BY MOUTH 2 (TWO) TIMES DAILY    . fluconazole (DIFLUCAN) 150 MG tablet Take 1 tablet (150 mg total) by mouth once for 1 dose. Repeat after 72 hours 2 tablet 0   No current facility-administered medications for this visit.     Allergies: Cefepime, Mirtazapine, Nsaids, Prochlorperazine, and Vancomycin  History reviewed. No pertinent past medical history.  History reviewed. No pertinent surgical history.  History reviewed. No pertinent family history.  Social History   Tobacco Use  . Smoking status: Never Smoker  . Smokeless tobacco: Never Used  Substance Use Topics  . Alcohol use: Not on file    Subjective:  Patient presents with concerns for vaginal discharge x 1 month; + pelvic pain; is suspicious that she has BV; has been having increased spotting recently; occasional vaginal itching; no burning with urination, no urinary frequency;   Scheduled for sinus surgery tomorrow at [facility]- deviated septum repair;   LMP- 2 weeks ago; tubal ligation;      Objective:  Vitals:   08/11/18 1015  BP:  106/68  Pulse: 71  Temp: 98.1 F (36.7 C)  TempSrc: Oral  SpO2: 99%  Weight: 111 lb 9.6 oz (50.6 kg)  Height: 5\' 6"  (1.676 m)    General: Well developed, well nourished, in no acute distress  Skin : Warm and dry.  Head: Normocephalic and atraumatic  Lungs: Respirations unlabored;  Neurologic: Alert and oriented; speech intact; face symmetrical;    Assessment:  1. Pelvic pain   2. Vaginal discharge   3. Irregular bleeding     Plan:  Update pelvic ultrasound; update Thin Prep pap, wet prep today; may need to refer to GYN; Rx for Diflucan 150 mg  for suspected yeast infection; may need to treat for BV; follow-up to be determined.   No follow-ups on file.  Orders Placed This Encounter  Procedures  . Wet prep, genital    Standing Status:   Future    Standing Expiration Date:   08/11/2019  . US Pelvic Complete With Transvaginal    Standing Status:   Future    Standing Expiration Date:   10/12/2019    Order Specific Question:   Reason for Exam (SYMPTOM  OR DIAGNOSIS REQUIRED)    Answer:   pelvic pain    Order Specific Question:   Preferred imaging location?    Answer:   [facility]    Requested Prescriptions   Signed Prescriptions Disp Refills  . fluconazole (DIFLUCAN) 150 MG tablet 2 tablet 0    Sig: Take 1 tablet (150 mg total) by mouth once for 1 dose. Repeat after 72 hours

## 2018-08-12 LAB — CYTOLOGY - PAP
Chlamydia: NEGATIVE
Diagnosis: NEGATIVE
HPV: NOT DETECTED
Neisseria Gonorrhea: NEGATIVE
Trichomonas: NEGATIVE

## 2018-08-25 ENCOUNTER — Other Ambulatory Visit: Payer: 59

## 2018-08-26 ENCOUNTER — Ambulatory Visit
Admission: RE | Admit: 2018-08-26 | Discharge: 2018-08-26 | Disposition: A | Discharge status: Home / Self Care | Payer: 59 | Source: Ambulatory Visit | Attending: Family | Admitting: Family

## 2018-08-26 DIAGNOSIS — R102 Pelvic and perineal pain: Secondary | ICD-10-CM

## 2018-08-31 ENCOUNTER — Ambulatory Visit: Payer: 59 | Admitting: Psychology

## 2018-09-06 ENCOUNTER — Telehealth: Payer: Self-pay

## 2018-09-06 NOTE — Telephone Encounter (Signed)
Spoke with patient today. She had called and checked her messages after calling to leave message so info received. She will speak with specialist at [facility] for further workup.

## 2018-09-14 ENCOUNTER — Ambulatory Visit: Payer: Self-pay | Admitting: Psychology

## 2018-09-14 ENCOUNTER — Ambulatory Visit: Payer: 59 | Admitting: Psychology

## 2018-09-28 ENCOUNTER — Ambulatory Visit: Payer: 59 | Admitting: Psychology

## 2019-07-01 ENCOUNTER — Encounter: Payer: Self-pay | Admitting: Emergency Medicine

## 2019-07-01 ENCOUNTER — Other Ambulatory Visit: Payer: Self-pay

## 2019-07-01 ENCOUNTER — Emergency Department: Payer: 59

## 2019-07-01 ENCOUNTER — Emergency Department
Admission: EM | Admit: 2019-07-01 | Discharge: 2019-07-01 | Disposition: A | Discharge status: Home / Self Care | Payer: 59 | Attending: Emergency Medicine | Admitting: Emergency Medicine

## 2019-07-01 DIAGNOSIS — N182 Chronic kidney disease, stage 2 (mild): Secondary | ICD-10-CM | POA: Diagnosis not present

## 2019-07-01 DIAGNOSIS — E1122 Type 2 diabetes mellitus with diabetic chronic kidney disease: Secondary | ICD-10-CM | POA: Insufficient documentation

## 2019-07-01 DIAGNOSIS — G43809 Other migraine, not intractable, without status migrainosus: Secondary | ICD-10-CM | POA: Insufficient documentation

## 2019-07-01 DIAGNOSIS — R519 Headache, unspecified: Secondary | ICD-10-CM | POA: Diagnosis not present

## 2019-07-01 DIAGNOSIS — Z942 Lung transplant status: Secondary | ICD-10-CM | POA: Insufficient documentation

## 2019-07-01 DIAGNOSIS — R9431 Abnormal electrocardiogram [ECG] [EKG]: Secondary | ICD-10-CM | POA: Diagnosis not present

## 2019-07-01 HISTORY — DX: Cystic fibrosis, unspecified: E84.9

## 2019-07-01 LAB — BASIC METABOLIC PANEL
Anion gap: 8 (ref 5–15)
BUN: 33 mg/dL — ABNORMAL HIGH (ref 6–20)
CO2: 24 mmol/L (ref 22–32)
Calcium: 9.2 mg/dL (ref 8.9–10.3)
Chloride: 104 mmol/L (ref 98–111)
Creatinine, Ser: 1.46 mg/dL — ABNORMAL HIGH (ref 0.44–1.00)
GFR calc Af Amer: 53 mL/min — ABNORMAL LOW (ref >60)
GFR calc non Af Amer: 46 mL/min — ABNORMAL LOW (ref >60)
Glucose, Bld: 147 mg/dL — ABNORMAL HIGH (ref 70–99)
Potassium: 3.7 mmol/L (ref 3.5–5.1)
Sodium: 136 mmol/L (ref 135–145)

## 2019-07-01 LAB — CBC WITH DIFFERENTIAL/PLATELET
Abs Immature Granulocytes: 0.03 10*3/uL (ref 0.00–0.07)
Basophils Absolute: 0 10*3/uL (ref 0.0–0.1)
Basophils Relative: 0 %
Eosinophils Absolute: 0.1 10*3/uL (ref 0.0–0.5)
Eosinophils Relative: 3 %
HCT: 33.7 % — ABNORMAL LOW (ref 36.0–46.0)
Hemoglobin: 11.5 g/dL — ABNORMAL LOW (ref 12.0–15.0)
Immature Granulocytes: 1 %
Lymphocytes Relative: 31 %
Lymphs Abs: 1.6 10*3/uL (ref 0.7–4.0)
MCH: 31.1 pg (ref 26.0–34.0)
MCHC: 34.1 g/dL (ref 30.0–36.0)
MCV: 91.1 fL (ref 80.0–100.0)
Monocytes Absolute: 0.5 10*3/uL (ref 0.1–1.0)
Monocytes Relative: 10 %
Neutro Abs: 2.9 10*3/uL (ref 1.7–7.7)
Neutrophils Relative %: 55 %
Platelets: 180 10*3/uL (ref 150–400)
RBC: 3.7 MIL/uL — ABNORMAL LOW (ref 3.87–5.11)
RDW: 13 % (ref 11.5–15.5)
WBC: 5.2 10*3/uL (ref 4.0–10.5)
nRBC: 0 % (ref 0.0–0.2)

## 2019-07-01 LAB — POCT PREGNANCY, URINE: Preg Test, Ur: NEGATIVE

## 2019-07-01 MED ORDER — METOCLOPRAMIDE HCL 5 MG/ML IJ SOLN
5.0000 mg | Freq: Once | INTRAMUSCULAR | Status: AC
  Administered 2019-07-01: 5 mg via INTRAVENOUS
  Filled 2019-07-01: qty 2

## 2019-07-01 MED ORDER — DIPHENHYDRAMINE HCL 50 MG/ML IJ SOLN
12.5000 mg | Freq: Once | INTRAMUSCULAR | Status: AC
  Administered 2019-07-01: 12.5 mg via INTRAVENOUS
  Filled 2019-07-01: qty 1

## 2019-07-01 MED ORDER — ONDANSETRON HCL 4 MG/2ML IJ SOLN: 4.0000 mg | Freq: Once | INTRAMUSCULAR | Status: AC

## 2019-07-01 MED ORDER — MAGNESIUM SULFATE IN D5W 1-5 GM/100ML-% IV SOLN
1.0000 g | Freq: Once | INTRAVENOUS | Status: AC
  Administered 2019-07-01: 1 g via INTRAVENOUS
  Filled 2019-07-01: qty 100

## 2019-07-01 MED ORDER — DROPERIDOL 2.5 MG/ML IJ SOLN
1.2500 mg | Freq: Once | INTRAMUSCULAR | Status: AC
  Administered 2019-07-01: 1.25 mg via INTRAVENOUS
  Filled 2019-07-01: qty 2

## 2019-07-01 MED ORDER — ONDANSETRON HCL 4 MG/2ML IJ SOLN
INTRAMUSCULAR | Status: AC
  Administered 2019-07-01: 4 mg via INTRAVENOUS
  Filled 2019-07-01: qty 2

## 2019-07-01 MED ORDER — ACETAMINOPHEN 500 MG PO TABS
1000.0000 mg | ORAL_TABLET | Freq: Once | ORAL | Status: AC
  Administered 2019-07-01: 1000 mg via ORAL
  Filled 2019-07-01: qty 2

## 2019-07-01 NOTE — ED Notes (Signed)
Pt to CT

## 2019-07-01 NOTE — Discharge Instructions (Addendum)
Is mostly consistent with her typical migraines.  You can take your migraine medicine that you have at home.  Return the ER if you develop worsening pain or any other concerns  IMPRESSION:  1. Unremarkable appearance of the brain.  2. Mucosal thickening in the covered paranasal sinuses.

## 2019-07-01 NOTE — ED Notes (Signed)
Pt in with co migraines hx of the same. States started yesterday, worse to left head and neck area. Did take rx migraine med and tylenol without relief. Has been vomiting multiple times since, not able to keep food or fluids down.

## 2019-07-01 NOTE — ED Provider Notes (Signed)
[facility] Emergency Department Provider Note  ____________________________________________   First MD Initiated Contact with Patient 07/01/19 [phone]     (approximate)  I have reviewed the triage vital signs and the nursing notes.   HISTORY  Chief Complaint Migraine    HPI Stacy Oconnor is a 34 y.o. female with cystic fibrosis status post lung transplant, diabetes, CKD who comes in with migraine.  Patient reports a migraine that started at 6 PM tonight.  Patient states that the migraine is gradually getting worse.  The migraine is now severe, constant, nothing makes it better, nothing makes it worse.  Denies any vision changes.  States that when the pain gets about she developed some twitching sensation.  She denies any fevers, neck stiffness.  Patient states that this pain is similar to her prior migraines but is worse than prior given she has not needed to come to the ER before for them.  Denies it being sudden onset and thunderclap in nature.  She denies any vision changes.          Past Medical History:  Diagnosis Date  . Cystic fibrosis [facility])     Patient Active Problem List   Diagnosis Date Noted  . Cough 03/31/2018  . 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 Moderate protein-calorie malnutrition ([place]) 03/05/2017  . 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 CKD (chronic kidney disease) stage 2, GFR 60-89 ml/min 04/15/2016  . 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 Vitamin D deficiency, unspecified 08/18/2012  . Immunosuppression ([place]) 08/06/2012  . Anxiety 07/25/2012  . Lung transplant status ([place]) 07/18/2012  . History of cholelithiasis 07/03/2012  . Cystic fibrosis of the lung ([place]) 06/22/2012  . Diabetes mellitus ([place]) 05/29/2011  . Gastro-esophageal reflux disease without esophagitis 02/19/2010    Past Surgical History:  Procedure Laterality Date  . LUNG TRANSPLANT      Prior to  Admission medications   Medication Sig Start Date End Date Taking? Authorizing Provider  albuterol (VENTOLIN HFA) 108 (90 Base) MCG/ACT inhaler INHALE 2 INHALATIONS INTO THE LUNGS EVERY 4 (FOUR) HOURS AS NEEDED FOR WHEEZING 04/14/18   [provider]  Assure Comfort Lancets 28G MISC Inject into the skin. 06/10/17   [provider]  Continuous Blood Gluc Receiver (FREESTYLE LIBRE 14 DAY READER) DEVI Inject into the skin. 08/21/17   [provider]  CREON 24000-76000 units CPEP TAKE 4-5 CAPSULES WITH MEALS AND 2-3 WITH SNACKS 01/19/18   [provider]  [employer] D3 125 MCG (5000 UT) capsule TAKE 4 CAPSULES (20,000 UNITS TOTAL) BY MOUTH ONCE DAILY 01/05/18   [provider]  gabapentin (NEURONTIN) 100 MG capsule TAKE 2 TABS IN THE MORNING AND 3 TABS IN THE EVENING. YOU CAN DECREASE AS TOLERATED EVERY TWO WEEKS. 11/24/17   [provider]  glucose blood (PRECISION QID TEST) test strip Use 4 (four) times daily as needed Use as instructed. 06/10/17   [provider]  ibandronate (BONIVA) 150 MG tablet Take by mouth.    [provider]  MAGNESIUM GLUCONATE PO Take 400 mg by mouth.    [provider]  metroNIDAZOLE (FLAGYL) 500 MG tablet Take 1 tablet (500 mg total) by mouth 2 (two) times daily. 08/11/18   Marrian Salvage, FNP  mycophenolate (CELLCEPT) 250 MG capsule TAKE 4 CAPSULES (1,000 MG TOTAL) BY MOUTH 2 (TWO) TIMES DAILY 01/19/18   [provider]  ondansetron (ZOFRAN) 4 MG tablet Take by mouth.  01/29/18   [provider]  pantoprazole (PROTONIX) 40 MG tablet Take by mouth. 10/12/17   [provider]  predniSONE (DELTASONE) 5 MG tablet TAKE 2 TABLETS (10 MG TOTAL) BY MOUTH ONCE DAILY 01/29/18   [provider]  sertraline (ZOLOFT) 50 MG tablet Take 50 mg by mouth daily. 01/29/18   [provider]  sulfamethoxazole-trimethoprim (BACTRIM) 400-80 MG tablet TAKE 1 TABLET (80 MG OF  TRIMETHOPRIM TOTAL) BY MOUTH ONCE DAILY 08/05/18   [provider]  tacrolimus (PROGRAF) 5 MG capsule TAKE 2 CAPSULES (10 MG TOTAL) BY MOUTH 2 (TWO) TIMES DAILY 01/26/18   [provider]    Allergies Cefepime, Mirtazapine, Nsaids, Prochlorperazine, and Vancomycin  No family history on file.  Social History Social History   Tobacco Use  . Smoking status: Never Smoker  . Smokeless tobacco: Never Used  Substance Use Topics  . Alcohol use: Not on file  . Drug use: Not on file      Review of Systems Constitutional: No fever/chills Eyes: No visual changes. ENT: No sore throat. Cardiovascular: Denies chest pain. Respiratory: Denies shortness of breath. Gastrointestinal: No abdominal pain.  No nausea, no vomiting.  No diarrhea.  No constipation. Genitourinary: Negative for dysuria. Musculoskeletal: Negative for back pain. Skin: Negative for rash. Neurological: Negative for headaches, focal weakness or numbness. All other ROS negative ____________________________________________   PHYSICAL EXAM:  VITAL SIGNS: ED Triage Vitals  Enc Vitals Group     BP 07/01/19 0310 (!) 156/92     Pulse Rate 07/01/19 0310 73     Resp 07/01/19 0310 18     Temp 07/01/19 0310 97.8 F (36.6 C)     Temp Source 07/01/19 0310 Oral     SpO2 07/01/19 0310 100 %     Weight 07/01/19 0308 120 lb (54.4 kg)     Height 07/01/19 0308 5\' 6"  (1.676 m)     Head Circumference --      Peak Flow --      Pain Score 07/01/19 0307 9     Pain Loc --      Pain Edu? --      Excl. in [place]? --     Constitutional: Alert and oriented.  Appears to be in some pain.. Eyes: Conjunctivae are normal. EOMI. denies any vision changes Head: Atraumatic. Nose: No congestion/rhinnorhea. Mouth/Throat: Mucous membranes are moist.   Neck: No stridor. Trachea Midline. FROM Cardiovascular: Normal rate, regular rhythm. Grossly normal heart sounds.  Good peripheral circulation. Respiratory: Normal respiratory  effort.  No retractions. Lungs CTAB. Gastrointestinal: Soft and nontender. No distention. No abdominal bruits.  Musculoskeletal: No lower extremity tenderness nor edema.  No joint effusions. Neurologic:  Normal speech and language.  Cranial nerves II to XII are intact.  Equal strength in arms and legs.  Does occasionally have some twitching in her arm which patient reports is secondary to pain Skin:  Skin is warm, dry and intact. No rash noted. Psychiatric: Mood and affect are normal. Speech and behavior are normal. GU: Deferred   ____________________________________________   LABS (all labs ordered are listed, but only abnormal results are displayed)  Labs Reviewed  CBC WITH DIFFERENTIAL/PLATELET - Abnormal; Notable for the following components:      Result Value   RBC 3.70 (*)    Hemoglobin 11.5 (*)    HCT 33.7 (*)    All other components within normal limits  BASIC METABOLIC PANEL - Abnormal; Notable for the following components:   Glucose, Bld  147 (*)    BUN 33 (*)    Creatinine, Ser 1.46 (*)    GFR calc non Af Amer 46 (*)    GFR calc Af Amer 53 (*)    All other components within normal limits  POC URINE PREG, ED  POCT PREGNANCY, URINE   ____________________________________________   ED ECG REPORT I, Vanessa Liberty, the attending physician, personally viewed and interpreted this ECG.  EKG sinus rate of 64, no ST elevation, no T wave inversions, QTC of 463 ____________________________________________  RADIOLOGY  Official radiology report(s): CT Head Wo Contrast  Result Date: 07/01/2019 CLINICAL DATA:  Acute headache with normal neuro exam EXAM: CT HEAD WITHOUT CONTRAST TECHNIQUE: Contiguous axial images were obtained from the base of the skull through the vertex without intravenous contrast. COMPARISON:  None. FINDINGS: Brain: No evidence of acute infarction, hemorrhage, hydrocephalus, extra-axial collection or mass lesion/mass effect. Vascular: No hyperdense vessel or  unexpected calcification. Skull: Normal. Negative for fracture or focal lesion. Sinuses/Orbits: Mucosal thickening in the covered paranasal sinuses. IMPRESSION: 1. Unremarkable appearance of the brain. 2. Mucosal thickening in the covered paranasal sinuses. Electronically Signed   By: Monte Fantasia M.D.   On: 07/01/2019 05:14    ____________________________________________   PROCEDURES  Procedure(s) performed (including Critical Care):  Procedures   ____________________________________________   INITIAL IMPRESSION / ASSESSMENT AND PLAN / ED COURSE  Stacy Oconnor was evaluated in Emergency Department on 07/01/2019 for the symptoms described in the history of present illness. She was evaluated in the context of the global COVID-19 pandemic, which necessitated consideration that the patient might be at risk for infection with the SARS-CoV-2 virus that causes COVID-19. Institutional protocols and algorithms that pertain to the evaluation of patients at risk for COVID-19 are in a state of rapid change based on information released by regulatory bodies including the CDC and federal and state organizations. These policies and algorithms were followed during the patient's care in the ED.    Patient is a 34 year old with cystic fibrosis who comes in with migraines.  Patient denies it being sudden or severe in onset but has been gradually worsening over time so unlikely to be SAH.  Seems to be consistent with her prior migraines but we discussed CT imaging and she would like to proceed with CT just to make sure there is no signs of intercranial hemorrhage, mass, abscess or other acute pathology.  She denies any recent falls.  She denies any vision changes to suggest cavernous venous thrombosis.  Denies any fevers and is otherwise well-appearing I have low suspicion for meningitis.  She has no neck stiffness.   CT imaging is negative.  Does show some sinus disease but patient denies any active symptoms  related to this.  Her labs are otherwise around her baseline with a slightly elevated kidney function which patient states that she knows about.  Patient given Reglan, Zofran, Benadryl, Tylenol.  Patient states that her pain is come down to a 6 out of 10.  Will try some droperidol and some magnesium.  On reevaluation pain is down significantly.  At this point patient feels comfortable going home.  She states that she does not need any other medications at home.  Offered to write her a prescription for Fioricet and states that she has medicine at home for her migraines.  I discussed the provisional nature of ED diagnosis, the treatment so far, the ongoing plan of care, follow up appointments and return precautions with the patient and any  family or support people present. They expressed understanding and agreed with the plan, discharged home.   _________________________________   FINAL CLINICAL IMPRESSION(S) / ED DIAGNOSES   Final diagnoses:  Other migraine without status migrainosus, not intractable      MEDICATIONS GIVEN DURING THIS VISIT:  Medications  ondansetron (ZOFRAN) injection 4 mg (4 mg Intravenous Given 07/01/19 0328)  metoCLOPramide (REGLAN) injection 5 mg (5 mg Intravenous Given 07/01/19 0401)  diphenhydrAMINE (BENADRYL) injection 12.5 mg (12.5 mg Intravenous Given 07/01/19 0401)  acetaminophen (TYLENOL) tablet 1,000 mg (1,000 mg Oral Given 07/01/19 0455)  magnesium sulfate IVPB 1 g 100 mL (0 g Intravenous Stopped 07/01/19 0732)  droperidol (INAPSINE) 2.5 MG/ML injection 1.25 mg (1.25 mg Intravenous Given 07/01/19 0388)     ED Discharge Orders    None       Note:  This document was prepared using Dragon voice recognition software and may include unintentional dictation errors.   Vanessa , MD 07/01/19 [phone]

## 2019-07-01 NOTE — ED Triage Notes (Signed)
Pt to triage via w/c, skin pale, appears uncomfortable, grimacing; c/o frontal HA accomp by N/V since last night; st hx of same

## 2019-11-09 DIAGNOSIS — F32A Depression, unspecified: Secondary | ICD-10-CM | POA: Diagnosis not present

## 2019-11-09 DIAGNOSIS — Z942 Lung transplant status: Secondary | ICD-10-CM | POA: Diagnosis not present

## 2019-11-25 ENCOUNTER — Telehealth: Payer: Self-pay

## 2019-11-25 ENCOUNTER — Telehealth: Payer: Self-pay | Admitting: Family

## 2019-11-25 NOTE — Telephone Encounter (Signed)
Last note discussed with pt she is going to follow up with her pulmonologist.

## 2019-11-25 NOTE — Telephone Encounter (Signed)
We received a medical clearance form from her dentist; we have not see her since July 2020; she would have to be seen for me to complete this form.  It might be easier for her if she would see if her pulmonologist could do this for her without having to make an OV here.

## 2019-12-06 DIAGNOSIS — Z86711 Personal history of pulmonary embolism: Secondary | ICD-10-CM | POA: Diagnosis not present

## 2019-12-06 DIAGNOSIS — Z5181 Encounter for therapeutic drug level monitoring: Secondary | ICD-10-CM | POA: Diagnosis not present

## 2019-12-06 DIAGNOSIS — Z86718 Personal history of other venous thrombosis and embolism: Secondary | ICD-10-CM | POA: Diagnosis not present

## 2019-12-06 DIAGNOSIS — J324 Chronic pansinusitis: Secondary | ICD-10-CM | POA: Diagnosis not present

## 2019-12-06 DIAGNOSIS — K219 Gastro-esophageal reflux disease without esophagitis: Secondary | ICD-10-CM | POA: Diagnosis not present

## 2019-12-06 DIAGNOSIS — R918 Other nonspecific abnormal finding of lung field: Secondary | ICD-10-CM | POA: Diagnosis not present

## 2019-12-06 DIAGNOSIS — Z79899 Other long term (current) drug therapy: Secondary | ICD-10-CM | POA: Diagnosis not present

## 2019-12-06 DIAGNOSIS — R519 Headache, unspecified: Secondary | ICD-10-CM | POA: Diagnosis not present

## 2019-12-06 DIAGNOSIS — Z9281 Personal history of extracorporeal membrane oxygenation (ECMO): Secondary | ICD-10-CM | POA: Diagnosis not present

## 2019-12-06 DIAGNOSIS — Z942 Lung transplant status: Secondary | ICD-10-CM | POA: Diagnosis not present

## 2019-12-06 DIAGNOSIS — D84821 Immunodeficiency due to drugs: Secondary | ICD-10-CM | POA: Diagnosis not present

## 2019-12-06 DIAGNOSIS — D849 Immunodeficiency, unspecified: Secondary | ICD-10-CM | POA: Diagnosis not present

## 2019-12-06 DIAGNOSIS — F32A Depression, unspecified: Secondary | ICD-10-CM | POA: Diagnosis not present

## 2019-12-06 DIAGNOSIS — Z4824 Encounter for aftercare following lung transplant: Secondary | ICD-10-CM | POA: Diagnosis not present

## 2019-12-06 DIAGNOSIS — E559 Vitamin D deficiency, unspecified: Secondary | ICD-10-CM | POA: Diagnosis not present

## 2019-12-06 DIAGNOSIS — T86818 Other complications of lung transplant: Secondary | ICD-10-CM | POA: Diagnosis not present

## 2019-12-06 DIAGNOSIS — Z23 Encounter for immunization: Secondary | ICD-10-CM | POA: Diagnosis not present

## 2019-12-06 DIAGNOSIS — M810 Age-related osteoporosis without current pathological fracture: Secondary | ICD-10-CM | POA: Diagnosis not present

## 2019-12-07 DIAGNOSIS — Z4824 Encounter for aftercare following lung transplant: Secondary | ICD-10-CM | POA: Diagnosis not present

## 2019-12-07 DIAGNOSIS — E785 Hyperlipidemia, unspecified: Secondary | ICD-10-CM | POA: Diagnosis not present

## 2019-12-07 DIAGNOSIS — Z942 Lung transplant status: Secondary | ICD-10-CM | POA: Diagnosis not present

## 2019-12-30 DIAGNOSIS — B078 Other viral warts: Secondary | ICD-10-CM | POA: Diagnosis not present

## 2019-12-30 DIAGNOSIS — L989 Disorder of the skin and subcutaneous tissue, unspecified: Secondary | ICD-10-CM | POA: Diagnosis not present

## 2019-12-30 DIAGNOSIS — D485 Neoplasm of uncertain behavior of skin: Secondary | ICD-10-CM | POA: Diagnosis not present

## 2020-01-05 DIAGNOSIS — Z79899 Other long term (current) drug therapy: Secondary | ICD-10-CM | POA: Diagnosis not present

## 2020-01-05 DIAGNOSIS — Z4824 Encounter for aftercare following lung transplant: Secondary | ICD-10-CM | POA: Diagnosis not present

## 2020-01-05 DIAGNOSIS — Z5181 Encounter for therapeutic drug level monitoring: Secondary | ICD-10-CM | POA: Diagnosis not present

## 2020-01-05 DIAGNOSIS — Z942 Lung transplant status: Secondary | ICD-10-CM | POA: Diagnosis not present

## 2020-01-05 DIAGNOSIS — T86818 Other complications of lung transplant: Secondary | ICD-10-CM | POA: Diagnosis not present

## 2020-01-23 DIAGNOSIS — Z5181 Encounter for therapeutic drug level monitoring: Secondary | ICD-10-CM | POA: Diagnosis not present

## 2020-01-23 DIAGNOSIS — Z942 Lung transplant status: Secondary | ICD-10-CM | POA: Diagnosis not present

## 2020-01-23 DIAGNOSIS — Z4824 Encounter for aftercare following lung transplant: Secondary | ICD-10-CM | POA: Diagnosis not present

## 2020-01-23 DIAGNOSIS — Z79899 Other long term (current) drug therapy: Secondary | ICD-10-CM | POA: Diagnosis not present

## 2020-01-23 DIAGNOSIS — R942 Abnormal results of pulmonary function studies: Secondary | ICD-10-CM | POA: Diagnosis not present

## 2020-01-23 DIAGNOSIS — T86818 Other complications of lung transplant: Secondary | ICD-10-CM | POA: Diagnosis not present

## 2020-01-23 DIAGNOSIS — Z20822 Contact with and (suspected) exposure to covid-19: Secondary | ICD-10-CM | POA: Diagnosis not present

## 2020-02-03 DIAGNOSIS — Z942 Lung transplant status: Secondary | ICD-10-CM | POA: Diagnosis not present

## 2020-02-03 DIAGNOSIS — Z5181 Encounter for therapeutic drug level monitoring: Secondary | ICD-10-CM | POA: Diagnosis not present

## 2020-02-03 DIAGNOSIS — Z4824 Encounter for aftercare following lung transplant: Secondary | ICD-10-CM | POA: Diagnosis not present

## 2020-02-03 DIAGNOSIS — Z79899 Other long term (current) drug therapy: Secondary | ICD-10-CM | POA: Diagnosis not present

## 2020-02-03 DIAGNOSIS — T86818 Other complications of lung transplant: Secondary | ICD-10-CM | POA: Diagnosis not present

## 2020-02-08 DIAGNOSIS — F32A Depression, unspecified: Secondary | ICD-10-CM | POA: Diagnosis not present

## 2020-02-08 DIAGNOSIS — Z942 Lung transplant status: Secondary | ICD-10-CM | POA: Diagnosis not present

## 2020-04-17 DIAGNOSIS — I2699 Other pulmonary embolism without acute cor pulmonale: Secondary | ICD-10-CM | POA: Diagnosis not present

## 2020-04-17 DIAGNOSIS — Z5181 Encounter for therapeutic drug level monitoring: Secondary | ICD-10-CM | POA: Diagnosis not present

## 2020-04-17 DIAGNOSIS — Z942 Lung transplant status: Secondary | ICD-10-CM | POA: Diagnosis not present

## 2020-04-17 DIAGNOSIS — D849 Immunodeficiency, unspecified: Secondary | ICD-10-CM | POA: Diagnosis not present

## 2020-04-17 DIAGNOSIS — R942 Abnormal results of pulmonary function studies: Secondary | ICD-10-CM | POA: Diagnosis not present

## 2020-04-17 DIAGNOSIS — R519 Headache, unspecified: Secondary | ICD-10-CM | POA: Diagnosis not present

## 2020-04-17 DIAGNOSIS — J984 Other disorders of lung: Secondary | ICD-10-CM | POA: Diagnosis not present

## 2020-04-17 DIAGNOSIS — F32A Depression, unspecified: Secondary | ICD-10-CM | POA: Diagnosis not present

## 2020-04-17 DIAGNOSIS — M810 Age-related osteoporosis without current pathological fracture: Secondary | ICD-10-CM | POA: Diagnosis not present

## 2020-04-17 DIAGNOSIS — Z79899 Other long term (current) drug therapy: Secondary | ICD-10-CM | POA: Diagnosis not present

## 2020-04-17 DIAGNOSIS — J324 Chronic pansinusitis: Secondary | ICD-10-CM | POA: Diagnosis not present

## 2020-04-17 DIAGNOSIS — Z4824 Encounter for aftercare following lung transplant: Secondary | ICD-10-CM | POA: Diagnosis not present

## 2020-04-17 DIAGNOSIS — I82409 Acute embolism and thrombosis of unspecified deep veins of unspecified lower extremity: Secondary | ICD-10-CM | POA: Diagnosis not present

## 2020-04-17 DIAGNOSIS — K219 Gastro-esophageal reflux disease without esophagitis: Secondary | ICD-10-CM | POA: Diagnosis not present

## 2020-04-17 DIAGNOSIS — D84821 Immunodeficiency due to drugs: Secondary | ICD-10-CM | POA: Diagnosis not present

## 2020-04-17 DIAGNOSIS — T86818 Other complications of lung transplant: Secondary | ICD-10-CM | POA: Diagnosis not present

## 2020-04-28 ENCOUNTER — Emergency Department: Payer: 59

## 2020-04-28 ENCOUNTER — Encounter: Payer: Self-pay | Admitting: Emergency Medicine

## 2020-04-28 ENCOUNTER — Emergency Department
Admission: EM | Admit: 2020-04-28 | Discharge: 2020-04-28 | Disposition: A | Discharge status: Home / Self Care | Payer: 59 | Attending: Emergency Medicine | Admitting: Emergency Medicine

## 2020-04-28 ENCOUNTER — Other Ambulatory Visit: Payer: Self-pay

## 2020-04-28 DIAGNOSIS — R8281 Pyuria: Secondary | ICD-10-CM | POA: Diagnosis not present

## 2020-04-28 DIAGNOSIS — K3189 Other diseases of stomach and duodenum: Secondary | ICD-10-CM | POA: Diagnosis not present

## 2020-04-28 DIAGNOSIS — E1165 Type 2 diabetes mellitus with hyperglycemia: Secondary | ICD-10-CM | POA: Insufficient documentation

## 2020-04-28 DIAGNOSIS — I129 Hypertensive chronic kidney disease with stage 1 through stage 4 chronic kidney disease, or unspecified chronic kidney disease: Secondary | ICD-10-CM | POA: Diagnosis not present

## 2020-04-28 DIAGNOSIS — K828 Other specified diseases of gallbladder: Secondary | ICD-10-CM | POA: Diagnosis not present

## 2020-04-28 DIAGNOSIS — R109 Unspecified abdominal pain: Secondary | ICD-10-CM | POA: Diagnosis not present

## 2020-04-28 DIAGNOSIS — E1122 Type 2 diabetes mellitus with diabetic chronic kidney disease: Secondary | ICD-10-CM | POA: Insufficient documentation

## 2020-04-28 DIAGNOSIS — N179 Acute kidney failure, unspecified: Secondary | ICD-10-CM | POA: Diagnosis not present

## 2020-04-28 DIAGNOSIS — N182 Chronic kidney disease, stage 2 (mild): Secondary | ICD-10-CM | POA: Insufficient documentation

## 2020-04-28 DIAGNOSIS — Z9889 Other specified postprocedural states: Secondary | ICD-10-CM | POA: Diagnosis not present

## 2020-04-28 DIAGNOSIS — R739 Hyperglycemia, unspecified: Secondary | ICD-10-CM

## 2020-04-28 DIAGNOSIS — Z79899 Other long term (current) drug therapy: Secondary | ICD-10-CM | POA: Insufficient documentation

## 2020-04-28 DIAGNOSIS — K8689 Other specified diseases of pancreas: Secondary | ICD-10-CM | POA: Diagnosis not present

## 2020-04-28 DIAGNOSIS — R161 Splenomegaly, not elsewhere classified: Secondary | ICD-10-CM | POA: Diagnosis not present

## 2020-04-28 LAB — BASIC METABOLIC PANEL
Anion gap: 10 (ref 5–15)
Anion gap: 7 (ref 5–15)
BUN: 33 mg/dL — ABNORMAL HIGH (ref 6–20)
BUN: 27 mg/dL — ABNORMAL HIGH (ref 6–20)
CO2: 21 mmol/L — ABNORMAL LOW (ref 22–32)
CO2: 25 mmol/L (ref 22–32)
Calcium: 9.6 mg/dL (ref 8.9–10.3)
Calcium: 8.7 mg/dL — ABNORMAL LOW (ref 8.9–10.3)
Chloride: 101 mmol/L (ref 98–111)
Chloride: 102 mmol/L (ref 98–111)
Creatinine, Ser: 1.76 mg/dL — ABNORMAL HIGH (ref 0.44–1.00)
Creatinine, Ser: 1.42 mg/dL — ABNORMAL HIGH (ref 0.44–1.00)
GFR, Estimated: 38 mL/min — ABNORMAL LOW (ref >60)
GFR, Estimated: 49 mL/min — ABNORMAL LOW (ref >60)
Glucose, Bld: 268 mg/dL — ABNORMAL HIGH (ref 70–99)
Glucose, Bld: 195 mg/dL — ABNORMAL HIGH (ref 70–99)
Potassium: 4 mmol/L (ref 3.5–5.1)
Potassium: 4 mmol/L (ref 3.5–5.1)
Sodium: 132 mmol/L — ABNORMAL LOW (ref 135–145)
Sodium: 134 mmol/L — ABNORMAL LOW (ref 135–145)

## 2020-04-28 LAB — HEPATIC FUNCTION PANEL
ALT: 25 U/L (ref 0–44)
AST: 22 U/L (ref 15–41)
Albumin: 3.7 g/dL (ref 3.5–5.0)
Alkaline Phosphatase: 56 U/L (ref 38–126)
Bilirubin, Direct: 0.1 mg/dL (ref 0.0–0.2)
Indirect Bilirubin: 1.2 mg/dL — ABNORMAL HIGH (ref 0.3–0.9)
Total Bilirubin: 1.3 mg/dL — ABNORMAL HIGH (ref 0.3–1.2)
Total Protein: 6 g/dL — ABNORMAL LOW (ref 6.5–8.1)

## 2020-04-28 LAB — CBC
HCT: 36.3 % (ref 36.0–46.0)
Hemoglobin: 13 g/dL (ref 12.0–15.0)
MCH: 30.5 pg (ref 26.0–34.0)
MCHC: 35.8 g/dL (ref 30.0–36.0)
MCV: 85.2 fL (ref 80.0–100.0)
Platelets: 194 10*3/uL (ref 150–400)
RBC: 4.26 MIL/uL (ref 3.87–5.11)
RDW: 13.3 % (ref 11.5–15.5)
WBC: 4.3 10*3/uL (ref 4.0–10.5)
nRBC: 0 % (ref 0.0–0.2)

## 2020-04-28 LAB — URINALYSIS, ROUTINE W REFLEX MICROSCOPIC
Bacteria, UA: NONE SEEN
Bilirubin Urine: NEGATIVE
Glucose, UA: >=500 mg/dL — AB
Hgb urine dipstick: NEGATIVE
Ketones, ur: 5 mg/dL — AB
Leukocytes,Ua: NEGATIVE
Nitrite: NEGATIVE
Protein, ur: 30 mg/dL — AB
Specific Gravity, Urine: 1.021 (ref 1.005–1.030)
pH: 6 (ref 5.0–8.0)

## 2020-04-28 LAB — LIPASE, BLOOD: Lipase: 21 U/L (ref 11–51)

## 2020-04-28 LAB — PREGNANCY, URINE: Preg Test, Ur: NEGATIVE

## 2020-04-28 MED ORDER — OXYCODONE-ACETAMINOPHEN 5-325 MG PO TABS: 1.0000 | ORAL_TABLET | Freq: Once | ORAL | Status: DC

## 2020-04-28 MED ORDER — FENTANYL CITRATE (PF) 100 MCG/2ML IJ SOLN
50.0000 ug | Freq: Once | INTRAMUSCULAR | Status: AC
  Administered 2020-04-28: 50 ug via INTRAVENOUS
  Filled 2020-04-28: qty 2

## 2020-04-28 MED ORDER — LACTATED RINGERS IV BOLUS
1000.0000 mL | Freq: Once | INTRAVENOUS | Status: AC
  Administered 2020-04-28: 1000 mL via INTRAVENOUS

## 2020-04-28 MED ORDER — ONDANSETRON HCL 4 MG/2ML IJ SOLN
4.0000 mg | Freq: Once | INTRAMUSCULAR | Status: AC
  Administered 2020-04-28: 4 mg via INTRAVENOUS
  Filled 2020-04-28: qty 2

## 2020-04-28 MED ORDER — ONDANSETRON 4 MG PO TBDP: 4.0000 mg | ORAL_TABLET | Freq: Once | ORAL | Status: DC

## 2020-04-28 NOTE — ED Provider Notes (Signed)
[facility] Emergency Department Provider Note  ____________________________________________  Time seen: Approximately 4:53 AM  I have reviewed the triage vital signs and the nursing notes.   HISTORY  Chief Complaint Abdominal Pain and Emesis   HPI Stacy Oconnor is a 35 y.o. 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  Patient reports that she started having abdominal pain today.  She describes the pain as a constant dull pain diffusely across her abdomen associated with intermittent sharp stabbing pain.  The pain radiates to the right flank.  She has had urinary frequency associated with it.  She has had kidney stones in the past and reports that the pain feels similar.  She has had no fever or chills.  She has had nausea but no vomiting, no diarrhea or constipation  Past Medical History:  Diagnosis Date  . Cystic fibrosis [facility])     Patient Active Problem List   Diagnosis Date Noted  . Cough 03/31/2018  . 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 Moderate protein-calorie malnutrition ([place]) 03/05/2017  . 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 CKD (chronic kidney disease) stage 2, GFR 60-89 ml/min 04/15/2016  . 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 Vitamin D deficiency, unspecified 08/18/2012  . Immunosuppression ([place]) 08/06/2012  . Anxiety 07/25/2012  . Lung transplant status ([place]) 07/18/2012  . History of cholelithiasis 07/03/2012  . Cystic fibrosis of the lung ([place]) 06/22/2012  . Diabetes mellitus ([place]) 05/29/2011  . Gastro-esophageal reflux disease without esophagitis 02/19/2010    Past Surgical History:  Procedure Laterality Date  . LUNG TRANSPLANT    . TUBAL LIGATION      Prior to Admission medications   Medication Sig Start Date End Date Taking? Authorizing  Provider  albuterol (VENTOLIN HFA) 108 (90 Base) MCG/ACT inhaler INHALE 2 INHALATIONS INTO THE LUNGS EVERY 4 (FOUR) HOURS AS NEEDED FOR WHEEZING 04/14/18   [provider]  Assure Comfort Lancets 28G MISC Inject into the skin. 06/10/17   [provider]  Continuous Blood Gluc Receiver (FREESTYLE LIBRE 14 DAY READER) DEVI Inject into the skin. 08/21/17   [provider]  CREON 24000-76000 units CPEP TAKE 4-5 CAPSULES WITH MEALS AND 2-3 WITH SNACKS 01/19/18   [provider]  [employer] D3 125 MCG (5000 UT) capsule TAKE 4 CAPSULES (20,000 UNITS TOTAL) BY MOUTH ONCE DAILY 01/05/18   [provider]  gabapentin (NEURONTIN) 100 MG capsule TAKE 2 TABS IN THE MORNING AND 3 TABS IN THE EVENING. YOU CAN DECREASE AS TOLERATED EVERY TWO WEEKS. 11/24/17   [provider]  glucose blood (PRECISION QID TEST) test strip Use 4 (four) times daily as needed Use as instructed. 06/10/17   [provider]  ibandronate (BONIVA) 150 MG tablet Take by mouth.    [provider]  MAGNESIUM GLUCONATE PO Take 400 mg by mouth.    [provider]  metroNIDAZOLE (FLAGYL) 500 MG tablet Take 1 tablet (500 mg total) by mouth 2 (two) times daily. 08/11/18   Marrian Salvage, FNP  mycophenolate (CELLCEPT) 250 MG capsule TAKE 4 CAPSULES (1,000 MG TOTAL) BY MOUTH 2 (TWO) TIMES DAILY 01/19/18   [provider]  ondansetron (ZOFRAN) 4 MG tablet Take by mouth. 01/29/18   [provider]  pantoprazole (PROTONIX) 40 MG tablet Take by mouth. 10/12/17   [provider]  predniSONE (DELTASONE)  5 MG tablet TAKE 2 TABLETS (10 MG TOTAL) BY MOUTH ONCE DAILY 01/29/18   [provider]  sertraline (ZOLOFT) 50 MG tablet Take 50 mg by mouth daily. 01/29/18   [provider]  sulfamethoxazole-trimethoprim (BACTRIM) 400-80 MG tablet TAKE 1 TABLET (80 MG OF TRIMETHOPRIM TOTAL) BY MOUTH ONCE DAILY 08/05/18   [provider]  tacrolimus  (PROGRAF) 5 MG capsule TAKE 2 CAPSULES (10 MG TOTAL) BY MOUTH 2 (TWO) TIMES DAILY 01/26/18   [provider]    Allergies Cefepime, Mirtazapine, Nsaids, Prochlorperazine, and Vancomycin  No family history on file.  Social History Social History   Tobacco Use  . Smoking status: Never Smoker  . Smokeless tobacco: Never Used  Substance Use Topics  . Alcohol use: Not Currently  . Drug use: Not Currently    Review of Systems  Constitutional: Negative for fever. Eyes: Negative for visual changes. ENT: Negative for sore throat. Neck: No neck pain  Cardiovascular: Negative for chest pain. Respiratory: Negative for shortness of breath. Gastrointestinal: + abdominal pain and nausea. No vomiting or diarrhea. Genitourinary: Negative for dysuria. + frequency and R flank pain Musculoskeletal: Negative for back pain. Skin: Negative for rash. Neurological: Negative for headaches, weakness or numbness. Psych: No SI or HI  ____________________________________________   PHYSICAL EXAM:  VITAL SIGNS: ED Triage Vitals  Enc Vitals Group     BP 04/28/20 0107 (!) 128/91     Pulse Rate 04/28/20 0107 99     Resp 04/28/20 0107 20     Temp 04/28/20 0107 98.4 F (36.9 C)     Temp Source 04/28/20 0107 Oral     SpO2 04/28/20 0107 97 %     Weight 04/28/20 0109 118 lb (53.5 kg)     Height 04/28/20 0109 5\' 6"  (1.676 m)     Head Circumference --      Peak Flow --      Pain Score 04/28/20 0109 5     Pain Loc --      Pain Edu? --      Excl. in [place]? --     Constitutional: Alert and oriented. Well appearing and in no apparent distress. HEENT:      Head: Normocephalic and atraumatic.         Eyes: Conjunctivae are normal. Sclera is non-icteric.       Mouth/Throat: Mucous membranes are moist.       Neck: Supple with no signs of meningismus. Cardiovascular: Regular rate and rhythm. No murmurs, gallops, or rubs. 2+ symmetrical distal pulses are present in all extremities. No  JVD. Respiratory: Normal respiratory effort. Lungs are clear to auscultation bilaterally.  Gastrointestinal: Soft, mild tenderness mostly in the lower quadrants with no rebound or guarding, no right upper quadrant tenderness or Murphy sign . Genitourinary: No CVA tenderness. Musculoskeletal:  No edema, cyanosis, or erythema of extremities. Neurologic: Normal speech and language. Face is symmetric. Moving all extremities. No gross focal neurologic deficits are appreciated. Skin: Skin is warm, dry and intact. No rash noted. Psychiatric: Mood and affect are normal. Speech and behavior are normal.  ____________________________________________   LABS (all labs ordered are listed, but only abnormal results are displayed)  Labs Reviewed  BASIC METABOLIC PANEL - Abnormal; Notable for the following components:      Result Value   Sodium 132 (*)    CO2 21 (*)    Glucose, Bld 268 (*)    BUN 33 (*)    Creatinine, Ser 1.76 (*)  GFR, Estimated 38 (*)    All other components within normal limits  URINALYSIS, ROUTINE W REFLEX MICROSCOPIC - Abnormal; Notable for the following components:   Color, Urine YELLOW (*)    APPearance CLEAR (*)    Glucose, UA >=500 (*)    Ketones, ur 5 (*)    Protein, ur 30 (*)    All other components within normal limits  CBC  PREGNANCY, URINE  HEPATIC FUNCTION PANEL  LIPASE, BLOOD  BASIC METABOLIC PANEL   ____________________________________________  EKG  none  ____________________________________________  RADIOLOGY  I have personally reviewed the images performed during this visit and I agree with the Radiologist's read.   Interpretation by Radiologist:  CT Renal Stone Study  Result Date: 04/28/2020 CLINICAL DATA:  35 year old female with history of generalized abdominal and flank pain. EXAM: CT ABDOMEN AND PELVIS WITHOUT CONTRAST TECHNIQUE: Multidetector CT imaging of the abdomen and pelvis was performed following the standard protocol without IV  contrast. COMPARISON:  No priors. FINDINGS: Lower chest: Mild scarring in the left lung base. Hepatobiliary: No definite suspicious cystic or solid hepatic lesions are confidently identified on today's noncontrast CT examination. Gallbladder is filled with amorphous high attenuation material, compatible with biliary sludge. Gallbladder does not appear distended, nor is there surrounding pericholecystic fluid or inflammatory changes to suggest an acute cholecystitis at this time. Pancreas: Diffuse pancreatic atrophy. No definite pancreatic mass or peripancreatic fluid collections or inflammatory changes noted on today's noncontrast CT examination. Spleen: Spleen is enlarged measuring 11.0 x 6.0 x 13.3 cm (estimated splenic volume of 439 mL) . Adrenals/Urinary Tract: There are no abnormal calcifications within the collecting system of either kidney, along the course of either ureter, or within the lumen of the urinary bladder. No hydroureteronephrosis or perinephric stranding to suggest urinary tract obstruction at this time. The unenhanced appearance of the kidneys is unremarkable bilaterally. Unenhanced appearance of the urinary bladder is normal. Bilateral adrenal glands are normal in appearance. Stomach/Bowel: There are some postoperative changes associated with the stomach. High attenuation material is noted within the gastric lumen. Stomach is otherwise unremarkable in appearance. No pathologic dilatation of small bowel or colon. Appendix is normal in caliber measuring 6 mm, without surrounding inflammatory changes. The appendiceal lumen is filled with intermediate to high attenuation material, without discrete appendicoliths. Vascular/Lymphatic: No atherosclerotic calcifications in the thoracic aorta or the coronary arteries. Reproductive: Unenhanced appearance of the uterus and ovaries is unremarkable. Other: Trace volume of free fluid in the cul-de-sac, presumably physiologic in this young female patient. No  larger volume of ascites. No pneumoperitoneum. Musculoskeletal: There are no aggressive appearing lytic or blastic lesions noted in the visualized portions of the skeleton. IMPRESSION: 1. No acute findings are noted in the abdomen or pelvis to account for the patient's symptoms. 2. Biliary sludge filling the gallbladder lumen, without findings to suggest an acute cholecystitis at this time. 3. Splenomegaly. 4. Pancreatic atrophy. 5. Additional incidental findings, as above. Electronically Signed   By: Vinnie Langton M.D.   On: 04/28/2020 05:12   US ABDOMEN LIMITED RUQ (LIVER/GB)  Result Date: 04/28/2020 CLINICAL DATA:  35 year old female with history of abdominal pain. EXAM: ULTRASOUND ABDOMEN LIMITED RIGHT UPPER QUADRANT COMPARISON:  No priors. FINDINGS: Gallbladder: Abundant echogenic nonshadowing in mobile material is noted within the lumen of the gallbladder, favored to represent tumefactive sludge. No associated blood flow with in this region noted on color Doppler imaging. Gallbladder is only mildly distended, without gallbladder wall thickening or pericholecystic fluid. No sonographic Murphy sign  noted by sonographer. Common bile duct: Diameter: 3.3 mm Liver: Two hyperechoic lesions are noted in the right lobe of the liver measuring 2.2 x 1.8 x 1.9 cm and 2.1 x 1.8 x 1.8 cm. Within normal limits in parenchymal echogenicity. Portal vein is patent on color Doppler imaging with normal direction of blood flow towards the liver. Other: None. IMPRESSION: 1. Probable tumefactive sludge in the gallbladder. No findings to suggest an acute cholecystitis at this time. 2. 2 hyperechoic liver lesions, incompletely characterized on today's ultrasound examination. These may simply represent focal areas of fatty infiltration. These could be definitively characterized with follow-up nonemergent abdominal MRI with and without IV gadolinium if of clinical concern. Electronically Signed   By: Vinnie Langton M.D.   On:  04/28/2020 06:39      ____________________________________________   PROCEDURES  Procedure(s) performed: None Procedures Critical Care performed:  None ____________________________________________   INITIAL IMPRESSION / ASSESSMENT AND PLAN / ED COURSE  35 y.o. 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  Patient is well-appearing in no distress with normal vital signs, abdomen is soft with mild diffuse tenderness in the lower quadrants with no rebound or guarding.  Ddx UTI, STD, pregnancy, kidney stone, ovarian pathology, appendicitis, diverticulitis.  Labs show no leukocytosis.  Patient's kidney function is elevated at 1.76.  Review of medical records shows the patient's most recent creatinine done at [facility] was 1 month ago.  She does have elevated blood glucose of 268 with normal anion gap and no significant abnormalities with her bicarb, no signs of DKA.  Urine showing glucose and mild ketones but no signs of urinary tract infection or blood.  We will give IV fluids for hyperglycemia and AKI, will give fentanyl and Zofran for symptoms.  We will get a CT of the abdomen.  Old medical records reviewed.  _________________________ 6:52 AM on 04/28/2020 -----------------------------------------  CT suggestive of abnormal gallbladder therefore ultrasound was done.  Ultrasound showing tumefactive sludge in the gallbladder with no signs of acute cholecystitis. Patient is pain free. Discussed with Dr. Celine Ahr who recommended no intervention at this time.  She says that the tumefactive sludge just means very thickened and that is commonly seen in patients with cystic fibrosis.  As long as the patient is pain-free with normal labs and no signs of acute cholecystitis she can follow-up as an outpatient.. Plan to  repeat BMP after 2 L of LR to reassess patient's creatinine and blood glucose. Care transferred to dr. Corky Downs     _____________________________________________ Please note:  Patient was evaluated in Emergency Department today for the symptoms described in the history of present illness. Patient was evaluated in the context of the global COVID-19 pandemic, which necessitated consideration that the patient might be at risk for infection with the SARS-CoV-2 virus that causes COVID-19. Institutional protocols and algorithms that pertain to the evaluation of patients at risk for COVID-19 are in a state of rapid change based on information released by regulatory bodies including the CDC and federal and state organizations. These policies and algorithms were followed during the patient's care in the ED.  Some ED evaluations and interventions may be delayed as a result of limited staffing during the pandemic.   [place] Controlled Substance Database was reviewed by me. ____________________________________________   FINAL CLINICAL IMPRESSION(S) / ED DIAGNOSES   Final diagnoses:  Abdominal pain  Sludge in gallbladder  AKI (acute kidney injury) ([place])  Hyperglycemia      NEW MEDICATIONS STARTED DURING THIS VISIT:  ED Discharge Orders    None       Note:  This document was prepared using Dragon voice recognition software and may include unintentional dictation errors.    Alfred Levins, [place], MD 04/28/20 [phone]

## 2020-04-28 NOTE — ED Triage Notes (Signed)
Pt reports frequent urination, generalized abdominal pain and right flank pain, Pt reports history of kidney infection in the past reports symptoms are similar today. Pt talks in complete sentences no distress noted

## 2020-04-28 NOTE — Discharge Instructions (Addendum)

## 2020-05-30 DIAGNOSIS — Z79899 Other long term (current) drug therapy: Secondary | ICD-10-CM | POA: Diagnosis not present

## 2020-05-30 DIAGNOSIS — Z5181 Encounter for therapeutic drug level monitoring: Secondary | ICD-10-CM | POA: Diagnosis not present

## 2020-05-30 DIAGNOSIS — Z942 Lung transplant status: Secondary | ICD-10-CM | POA: Diagnosis not present

## 2020-05-30 DIAGNOSIS — T86818 Other complications of lung transplant: Secondary | ICD-10-CM | POA: Diagnosis not present

## 2020-06-11 DIAGNOSIS — Z942 Lung transplant status: Secondary | ICD-10-CM | POA: Diagnosis not present

## 2020-06-11 DIAGNOSIS — J324 Chronic pansinusitis: Secondary | ICD-10-CM | POA: Diagnosis not present

## 2020-06-11 DIAGNOSIS — R0683 Snoring: Secondary | ICD-10-CM | POA: Diagnosis not present

## 2020-06-11 DIAGNOSIS — R600 Localized edema: Secondary | ICD-10-CM | POA: Diagnosis not present

## 2020-06-11 DIAGNOSIS — R0981 Nasal congestion: Secondary | ICD-10-CM | POA: Diagnosis not present

## 2020-06-20 DIAGNOSIS — R0683 Snoring: Secondary | ICD-10-CM | POA: Diagnosis not present

## 2020-06-20 DIAGNOSIS — R400 Somnolence: Secondary | ICD-10-CM | POA: Diagnosis not present

## 2020-07-24 DIAGNOSIS — Z20822 Contact with and (suspected) exposure to covid-19: Secondary | ICD-10-CM | POA: Diagnosis not present

## 2020-08-10 DIAGNOSIS — Z942 Lung transplant status: Secondary | ICD-10-CM | POA: Diagnosis not present

## 2020-08-10 DIAGNOSIS — T86818 Other complications of lung transplant: Secondary | ICD-10-CM | POA: Diagnosis not present

## 2020-08-10 DIAGNOSIS — Z79899 Other long term (current) drug therapy: Secondary | ICD-10-CM | POA: Diagnosis not present

## 2020-08-10 DIAGNOSIS — Z5181 Encounter for therapeutic drug level monitoring: Secondary | ICD-10-CM | POA: Diagnosis not present

## 2020-08-28 DIAGNOSIS — J329 Chronic sinusitis, unspecified: Secondary | ICD-10-CM | POA: Diagnosis not present

## 2020-08-28 DIAGNOSIS — D849 Immunodeficiency, unspecified: Secondary | ICD-10-CM | POA: Diagnosis not present

## 2020-08-28 DIAGNOSIS — K828 Other specified diseases of gallbladder: Secondary | ICD-10-CM | POA: Diagnosis not present

## 2020-08-28 DIAGNOSIS — Z5181 Encounter for therapeutic drug level monitoring: Secondary | ICD-10-CM | POA: Diagnosis not present

## 2020-08-28 DIAGNOSIS — J324 Chronic pansinusitis: Secondary | ICD-10-CM | POA: Diagnosis not present

## 2020-08-28 DIAGNOSIS — T86818 Other complications of lung transplant: Secondary | ICD-10-CM | POA: Diagnosis not present

## 2020-08-28 DIAGNOSIS — R918 Other nonspecific abnormal finding of lung field: Secondary | ICD-10-CM | POA: Diagnosis not present

## 2020-08-28 DIAGNOSIS — D84821 Immunodeficiency due to drugs: Secondary | ICD-10-CM | POA: Diagnosis not present

## 2020-08-28 DIAGNOSIS — Z942 Lung transplant status: Secondary | ICD-10-CM | POA: Diagnosis not present

## 2020-08-28 DIAGNOSIS — J3489 Other specified disorders of nose and nasal sinuses: Secondary | ICD-10-CM | POA: Diagnosis not present

## 2020-08-28 DIAGNOSIS — Z79899 Other long term (current) drug therapy: Secondary | ICD-10-CM | POA: Diagnosis not present

## 2020-09-20 DIAGNOSIS — E089 Diabetes mellitus due to underlying condition without complications: Secondary | ICD-10-CM | POA: Diagnosis not present

## 2020-10-04 DIAGNOSIS — U071 COVID-19: Secondary | ICD-10-CM | POA: Diagnosis not present

## 2020-10-21 DIAGNOSIS — R0683 Snoring: Secondary | ICD-10-CM | POA: Diagnosis not present

## 2020-10-21 DIAGNOSIS — G4710 Hypersomnia, unspecified: Secondary | ICD-10-CM | POA: Diagnosis not present

## 2020-10-21 DIAGNOSIS — R400 Somnolence: Secondary | ICD-10-CM | POA: Diagnosis not present

## 2020-11-01 DIAGNOSIS — T86811 Lung transplant failure: Secondary | ICD-10-CM | POA: Diagnosis not present

## 2020-11-01 DIAGNOSIS — E089 Diabetes mellitus due to underlying condition without complications: Secondary | ICD-10-CM | POA: Diagnosis not present

## 2020-11-01 DIAGNOSIS — D801 Nonfamilial hypogammaglobulinemia: Secondary | ICD-10-CM | POA: Diagnosis not present

## 2020-11-01 DIAGNOSIS — I724 Aneurysm of artery of lower extremity: Secondary | ICD-10-CM | POA: Diagnosis not present

## 2020-11-01 DIAGNOSIS — Z794 Long term (current) use of insulin: Secondary | ICD-10-CM | POA: Diagnosis not present

## 2020-11-01 DIAGNOSIS — Z01818 Encounter for other preprocedural examination: Secondary | ICD-10-CM | POA: Diagnosis not present

## 2020-11-01 DIAGNOSIS — D702 Other drug-induced agranulocytosis: Secondary | ICD-10-CM | POA: Diagnosis not present

## 2020-11-08 DIAGNOSIS — I129 Hypertensive chronic kidney disease with stage 1 through stage 4 chronic kidney disease, or unspecified chronic kidney disease: Secondary | ICD-10-CM | POA: Diagnosis not present

## 2020-11-08 DIAGNOSIS — J343 Hypertrophy of nasal turbinates: Secondary | ICD-10-CM | POA: Diagnosis not present

## 2020-11-08 DIAGNOSIS — D631 Anemia in chronic kidney disease: Secondary | ICD-10-CM | POA: Diagnosis not present

## 2020-11-08 DIAGNOSIS — Z8616 Personal history of COVID-19: Secondary | ICD-10-CM | POA: Diagnosis not present

## 2020-11-08 DIAGNOSIS — M810 Age-related osteoporosis without current pathological fracture: Secondary | ICD-10-CM | POA: Diagnosis not present

## 2020-11-08 DIAGNOSIS — N182 Chronic kidney disease, stage 2 (mild): Secondary | ICD-10-CM | POA: Diagnosis not present

## 2020-11-08 DIAGNOSIS — E559 Vitamin D deficiency, unspecified: Secondary | ICD-10-CM | POA: Diagnosis not present

## 2020-11-08 DIAGNOSIS — J342 Deviated nasal septum: Secondary | ICD-10-CM | POA: Diagnosis not present

## 2020-11-08 DIAGNOSIS — Z85118 Personal history of other malignant neoplasm of bronchus and lung: Secondary | ICD-10-CM | POA: Diagnosis not present

## 2020-11-08 DIAGNOSIS — J324 Chronic pansinusitis: Secondary | ICD-10-CM | POA: Diagnosis not present

## 2020-11-08 DIAGNOSIS — Z86718 Personal history of other venous thrombosis and embolism: Secondary | ICD-10-CM | POA: Diagnosis not present

## 2020-11-08 DIAGNOSIS — Z79899 Other long term (current) drug therapy: Secondary | ICD-10-CM | POA: Diagnosis not present

## 2020-11-08 DIAGNOSIS — E785 Hyperlipidemia, unspecified: Secondary | ICD-10-CM | POA: Diagnosis not present

## 2020-11-08 DIAGNOSIS — K219 Gastro-esophageal reflux disease without esophagitis: Secondary | ICD-10-CM | POA: Diagnosis not present

## 2020-11-08 DIAGNOSIS — D509 Iron deficiency anemia, unspecified: Secondary | ICD-10-CM | POA: Diagnosis not present

## 2020-11-08 DIAGNOSIS — Z86711 Personal history of pulmonary embolism: Secondary | ICD-10-CM | POA: Diagnosis not present

## 2020-11-08 DIAGNOSIS — J329 Chronic sinusitis, unspecified: Secondary | ICD-10-CM | POA: Diagnosis not present

## 2020-11-08 DIAGNOSIS — E1122 Type 2 diabetes mellitus with diabetic chronic kidney disease: Secondary | ICD-10-CM | POA: Diagnosis not present

## 2020-11-21 DIAGNOSIS — J3489 Other specified disorders of nose and nasal sinuses: Secondary | ICD-10-CM | POA: Diagnosis not present

## 2020-11-29 DIAGNOSIS — E089 Diabetes mellitus due to underlying condition without complications: Secondary | ICD-10-CM | POA: Diagnosis not present

## 2020-12-03 DIAGNOSIS — E089 Diabetes mellitus due to underlying condition without complications: Secondary | ICD-10-CM | POA: Diagnosis not present

## 2020-12-04 DIAGNOSIS — D849 Immunodeficiency, unspecified: Secondary | ICD-10-CM | POA: Diagnosis not present

## 2020-12-04 DIAGNOSIS — J324 Chronic pansinusitis: Secondary | ICD-10-CM | POA: Diagnosis not present

## 2020-12-04 DIAGNOSIS — Z942 Lung transplant status: Secondary | ICD-10-CM | POA: Diagnosis not present

## 2020-12-04 DIAGNOSIS — Z23 Encounter for immunization: Secondary | ICD-10-CM | POA: Diagnosis not present

## 2020-12-04 DIAGNOSIS — Z5181 Encounter for therapeutic drug level monitoring: Secondary | ICD-10-CM | POA: Diagnosis not present

## 2020-12-12 DIAGNOSIS — J324 Chronic pansinusitis: Secondary | ICD-10-CM | POA: Diagnosis not present

## 2021-01-15 DIAGNOSIS — Z79899 Other long term (current) drug therapy: Secondary | ICD-10-CM | POA: Diagnosis not present

## 2021-01-15 DIAGNOSIS — T86818 Other complications of lung transplant: Secondary | ICD-10-CM | POA: Diagnosis not present

## 2021-01-15 DIAGNOSIS — J90 Pleural effusion, not elsewhere classified: Secondary | ICD-10-CM | POA: Diagnosis not present

## 2021-01-15 DIAGNOSIS — Z4824 Encounter for aftercare following lung transplant: Secondary | ICD-10-CM | POA: Diagnosis not present

## 2021-01-15 DIAGNOSIS — J324 Chronic pansinusitis: Secondary | ICD-10-CM | POA: Diagnosis not present

## 2021-01-15 DIAGNOSIS — Z5181 Encounter for therapeutic drug level monitoring: Secondary | ICD-10-CM | POA: Diagnosis not present

## 2021-01-15 DIAGNOSIS — J0100 Acute maxillary sinusitis, unspecified: Secondary | ICD-10-CM | POA: Diagnosis not present

## 2021-01-15 DIAGNOSIS — D849 Immunodeficiency, unspecified: Secondary | ICD-10-CM | POA: Diagnosis not present

## 2021-01-15 DIAGNOSIS — Z942 Lung transplant status: Secondary | ICD-10-CM | POA: Diagnosis not present

## 2021-04-09 ENCOUNTER — Other Ambulatory Visit: Payer: Self-pay

## 2021-04-09 ENCOUNTER — Emergency Department
Admission: EM | Admit: 2021-04-09 | Discharge: 2021-04-09 | Disposition: A | Discharge status: Home / Self Care | Payer: Medicare (Managed Care) | Attending: Emergency Medicine | Admitting: Emergency Medicine

## 2021-04-09 ENCOUNTER — Encounter: Payer: Self-pay | Admitting: Emergency Medicine

## 2021-04-09 DIAGNOSIS — E871 Hypo-osmolality and hyponatremia: Secondary | ICD-10-CM | POA: Insufficient documentation

## 2021-04-09 DIAGNOSIS — R112 Nausea with vomiting, unspecified: Secondary | ICD-10-CM

## 2021-04-09 DIAGNOSIS — E1065 Type 1 diabetes mellitus with hyperglycemia: Secondary | ICD-10-CM | POA: Diagnosis not present

## 2021-04-09 DIAGNOSIS — R739 Hyperglycemia, unspecified: Secondary | ICD-10-CM

## 2021-04-09 DIAGNOSIS — Z20822 Contact with and (suspected) exposure to covid-19: Secondary | ICD-10-CM | POA: Diagnosis not present

## 2021-04-09 DIAGNOSIS — I10 Essential (primary) hypertension: Secondary | ICD-10-CM

## 2021-04-09 DIAGNOSIS — E1022 Type 1 diabetes mellitus with diabetic chronic kidney disease: Secondary | ICD-10-CM | POA: Diagnosis not present

## 2021-04-09 DIAGNOSIS — D72819 Decreased white blood cell count, unspecified: Secondary | ICD-10-CM | POA: Insufficient documentation

## 2021-04-09 DIAGNOSIS — R519 Headache, unspecified: Secondary | ICD-10-CM

## 2021-04-09 DIAGNOSIS — R809 Proteinuria, unspecified: Secondary | ICD-10-CM | POA: Diagnosis not present

## 2021-04-09 DIAGNOSIS — Z942 Lung transplant status: Secondary | ICD-10-CM

## 2021-04-09 DIAGNOSIS — I129 Hypertensive chronic kidney disease with stage 1 through stage 4 chronic kidney disease, or unspecified chronic kidney disease: Secondary | ICD-10-CM | POA: Insufficient documentation

## 2021-04-09 DIAGNOSIS — R7989 Other specified abnormal findings of blood chemistry: Secondary | ICD-10-CM | POA: Diagnosis not present

## 2021-04-09 DIAGNOSIS — N189 Chronic kidney disease, unspecified: Secondary | ICD-10-CM | POA: Diagnosis not present

## 2021-04-09 LAB — BLOOD GAS, VENOUS
Acid-base deficit: 0.8 mmol/L (ref 0.0–2.0)
Bicarbonate: 24.3 mmol/L (ref 20.0–28.0)
O2 Saturation: 91.3 %
Patient temperature: 37
pCO2, Ven: 41 mmHg — ABNORMAL LOW (ref 44–60)
pH, Ven: 7.38 (ref 7.25–7.43)
pO2, Ven: 56 mmHg — ABNORMAL HIGH (ref 32–45)

## 2021-04-09 LAB — CBC WITH DIFFERENTIAL/PLATELET
Abs Immature Granulocytes: 0 10*3/uL (ref 0.00–0.07)
Basophils Absolute: 0 10*3/uL (ref 0.0–0.1)
Basophils Relative: 0 %
Eosinophils Absolute: 0.1 10*3/uL (ref 0.0–0.5)
Eosinophils Relative: 2 %
HCT: 35.9 % — ABNORMAL LOW (ref 36.0–46.0)
Hemoglobin: 12.1 g/dL (ref 12.0–15.0)
Immature Granulocytes: 0 %
Lymphocytes Relative: 30 %
Lymphs Abs: 0.8 10*3/uL (ref 0.7–4.0)
MCH: 29.4 pg (ref 26.0–34.0)
MCHC: 33.7 g/dL (ref 30.0–36.0)
MCV: 87.3 fL (ref 80.0–100.0)
Monocytes Absolute: 0.2 10*3/uL (ref 0.1–1.0)
Monocytes Relative: 9 %
Neutro Abs: 1.5 10*3/uL — ABNORMAL LOW (ref 1.7–7.7)
Neutrophils Relative %: 59 %
Platelets: 206 10*3/uL (ref 150–400)
RBC: 4.11 MIL/uL (ref 3.87–5.11)
RDW: 14.5 % (ref 11.5–15.5)
WBC: 2.6 10*3/uL — ABNORMAL LOW (ref 4.0–10.5)
nRBC: 0 % (ref 0.0–0.2)

## 2021-04-09 LAB — URINALYSIS, COMPLETE (UACMP) WITH MICROSCOPIC
Bacteria, UA: NONE SEEN
Bilirubin Urine: NEGATIVE
Glucose, UA: >=500 mg/dL — AB
Hgb urine dipstick: NEGATIVE
Ketones, ur: NEGATIVE mg/dL
Leukocytes,Ua: NEGATIVE
Nitrite: NEGATIVE
Protein, ur: 30 mg/dL — AB
Specific Gravity, Urine: 1.035 — ABNORMAL HIGH (ref 1.005–1.030)
pH: 5 (ref 5.0–8.0)

## 2021-04-09 LAB — COMPREHENSIVE METABOLIC PANEL
ALT: 24 U/L (ref 0–44)
AST: 24 U/L (ref 15–41)
Albumin: 4.6 g/dL (ref 3.5–5.0)
Alkaline Phosphatase: 79 U/L (ref 38–126)
Anion gap: 8 (ref 5–15)
BUN: 19 mg/dL (ref 6–20)
CO2: 25 mmol/L (ref 22–32)
Calcium: 9.4 mg/dL (ref 8.9–10.3)
Chloride: 100 mmol/L (ref 98–111)
Creatinine, Ser: 1.17 mg/dL — ABNORMAL HIGH (ref 0.44–1.00)
GFR, Estimated: >60 mL/min (ref >60)
Glucose, Bld: 348 mg/dL — ABNORMAL HIGH (ref 70–99)
Potassium: 3.9 mmol/L (ref 3.5–5.1)
Sodium: 133 mmol/L — ABNORMAL LOW (ref 135–145)
Total Bilirubin: 1.2 mg/dL (ref 0.3–1.2)
Total Protein: 7.6 g/dL (ref 6.5–8.1)

## 2021-04-09 LAB — RESP PANEL BY RT-PCR (FLU A&B, COVID) ARPGX2
Influenza A by PCR: NEGATIVE
Influenza B by PCR: NEGATIVE
SARS Coronavirus 2 by RT PCR: NEGATIVE

## 2021-04-09 LAB — BETA-HYDROXYBUTYRIC ACID: Beta-Hydroxybutyric Acid: 0.16 mmol/L (ref 0.05–0.27)

## 2021-04-09 LAB — POC URINE PREG, ED: Preg Test, Ur: NEGATIVE

## 2021-04-09 LAB — LIPASE, BLOOD: Lipase: 21 U/L (ref 11–51)

## 2021-04-09 LAB — CBG MONITORING, ED
Glucose-Capillary: 342 mg/dL — ABNORMAL HIGH (ref 70–99)
Glucose-Capillary: 216 mg/dL — ABNORMAL HIGH (ref 70–99)

## 2021-04-09 MED ORDER — DIPHENHYDRAMINE HCL 50 MG/ML IJ SOLN
25.0000 mg | Freq: Once | INTRAMUSCULAR | Status: AC
  Administered 2021-04-09: 25 mg via INTRAVENOUS
  Filled 2021-04-09: qty 1

## 2021-04-09 MED ORDER — INSULIN ASPART 100 UNIT/ML IJ SOLN
0.0000 [IU] | INTRAMUSCULAR | Status: DC
  Administered 2021-04-09: 5 [IU] via SUBCUTANEOUS
  Filled 2021-04-09 (×2): qty 1

## 2021-04-09 MED ORDER — METOCLOPRAMIDE HCL 5 MG/ML IJ SOLN
10.0000 mg | Freq: Once | INTRAMUSCULAR | Status: AC
  Administered 2021-04-09: 10 mg via INTRAVENOUS
  Filled 2021-04-09: qty 2

## 2021-04-09 MED ORDER — METOCLOPRAMIDE HCL 10 MG PO TABS: 10.0000 mg | ORAL_TABLET | Freq: Four times a day (QID) | ORAL | 0 refills | Status: AC | PRN

## 2021-04-09 MED ORDER — SODIUM CHLORIDE 0.9 % IV SOLN
100.0000 mg | Freq: Once | INTRAVENOUS | Status: AC
  Administered 2021-04-09: 100 mg via INTRAVENOUS
  Filled 2021-04-09: qty 100

## 2021-04-09 MED ORDER — ONDANSETRON HCL 4 MG/2ML IJ SOLN
4.0000 mg | Freq: Once | INTRAMUSCULAR | Status: AC
  Administered 2021-04-09: 4 mg via INTRAVENOUS
  Filled 2021-04-09: qty 2

## 2021-04-09 MED ORDER — LACTATED RINGERS IV BOLUS
1000.0000 mL | Freq: Once | INTRAVENOUS | Status: AC
  Administered 2021-04-09: 1000 mL via INTRAVENOUS

## 2021-04-09 MED ORDER — MAGNESIUM SULFATE 2 GM/50ML IV SOLN
2.0000 g | Freq: Once | INTRAVENOUS | Status: AC
  Administered 2021-04-09: 2 g via INTRAVENOUS
  Filled 2021-04-09: qty 50

## 2021-04-09 NOTE — ED Provider Notes (Addendum)
? ?[facility] ?Provider Note ? ? ? Event Date/Time  ? First MD Initiated Contact with Patient 04/09/21 [phone]   ?  (approximate) ? ? ?History  ? ?Hyperglycemia ? ? ?HPI ? ?Stacy Oconnor is a 36 y.o. female with a past medical history of tubal ligation, bilateral lung transplant secondary to cystic fibrosis on immunosuppressive therapy, insulin-dependent diabetes, and CKD who presents for evaluation of headache and some tightness in her neck associate with nausea, vomiting and elevated blood sugar seen at home.  patient states her fingersticks were 400-500 at home.  He states she is worried she is in DKA.  She notes she had her annual screening bronchoscopy last week and was diagnosed with a staph infection on culture from her bronc and was post to pick up her doxycycline today but has not been able to do so.  She also did not take her 5 units of Lantus last night but otherwise has been taking her sliding scale insulin.  No chest pain, fevers, cough, shortness of breath, abdominal pain, back pain, urinary symptoms rash or extremity pain.  No focal weakness numbness or tingling.  Headache is more occipital radiating down the left trapezius.  She does have some sinus pressure as well in the front. ? ?  ?Past Surgical History:  ?Procedure Laterality Date  ? LUNG TRANSPLANT    ? TUBAL LIGATION    ? ? ? ?Physical Exam  ?Triage Vital Signs: ?ED Triage Vitals  ?Enc Vitals Group  ?   BP   ?   Pulse   ?   Resp   ?   Temp   ?   Temp src   ?   SpO2   ?   Weight   ?   Height   ?   Head Circumference   ?   Peak Flow   ?   Pain Score   ?   Pain Loc   ?   Pain Edu?   ?   Excl. in [place]?   ? ? ?Most recent vital signs: ?Vitals:  ? 04/09/21 0506  ?BP: (!) 159/105  ?Pulse: 68  ?Resp: 18  ?Temp: 97.8 ?F (36.6 ?C)  ?SpO2: 100%  ? ? ?General: Awake, no distress.  ?CV:  Regular rate and rhythm. ?Resp:  Normal effort.  ?Abd:  No distention.  Soft. ?Other:  Primary is membranes.  PERRLA.  EOMI.  Moving all extremity  spontaneously.  No obvious focal deficits. ? ? ?ED Results / Procedures / Treatments  ?Labs ?(all labs ordered are listed, but only abnormal results are displayed) ?Labs Reviewed  ?CBC WITH DIFFERENTIAL/PLATELET - Abnormal; Notable for the following components:  ?    Result Value  ? WBC 2.6 (*)   ? HCT 35.9 (*)   ? Neutro Abs 1.5 (*)   ? All other components within normal limits  ?COMPREHENSIVE METABOLIC PANEL - Abnormal; Notable for the following components:  ? Sodium 133 (*)   ? Glucose, Bld 348 (*)   ? Creatinine, Ser 1.17 (*)   ? All other components within normal limits  ?BLOOD GAS, VENOUS - Abnormal; Notable for the following components:  ? pCO2, Ven 41 (*)   ? pO2, Ven 56 (*)   ? All other components within normal limits  ?URINALYSIS, COMPLETE (UACMP) WITH MICROSCOPIC - Abnormal; Notable for the following components:  ? Color, Urine YELLOW (*)   ? APPearance HAZY (*)   ? Specific Gravity, Urine 1.035 (*)   ?  Glucose, UA >=500 (*)   ? Protein, ur 30 (*)   ? All other components within normal limits  ?CBG MONITORING, ED - Abnormal; Notable for the following components:  ? Glucose-Capillary 342 (*)   ? All other components within normal limits  ?RESP PANEL BY RT-PCR (FLU A&B, COVID) ARPGX2  ?BETA-HYDROXYBUTYRIC ACID  ?LIPASE, BLOOD  ?POC URINE PREG, ED  ? ? ? ?EKG ? ?ECG is remarkable for sinus bradycardia with a ventricular rate of 58, normal axis, unremarkable intervals without evidence of acute ischemia or significant arrhythmia. ? ? ?RADIOLOGY ? ? ?PROCEDURES: ? ?Critical Care performed: No ? ?.1-3 Lead EKG Interpretation ?Performed by: Lucrezia Starch, MD ?Authorized by: Lucrezia Starch, MD  ? ?  Interpretation: normal   ?  ECG rate assessment: normal   ?  Rhythm: sinus rhythm   ?  Ectopy: none   ?  Conduction: normal   ? ?The patient is on the cardiac monitor to evaluate for evidence of arrhythmia and/or significant heart rate changes. ? ? ?MEDICATIONS ORDERED IN ED: ?Medications  ?doxycycline  (VIBRAMYCIN) 100 mg in sodium chloride 0.9 % 250 mL IVPB (100 mg Intravenous New Bag/Given 04/09/21 0555)  ?insulin aspart (novoLOG) injection 0-15 Units (5 Units Subcutaneous Given 04/09/21 0651)  ?metoCLOPramide (REGLAN) injection 10 mg (has no administration in time range)  ?diphenhydrAMINE (BENADRYL) injection 25 mg (has no administration in time range)  ?lactated ringers bolus 1,000 mL (1,000 mLs Intravenous New Bag/Given 04/09/21 0557)  ?ondansetron [facility]) injection 4 mg (4 mg Intravenous Given 04/09/21 0556)  ?magnesium sulfate IVPB 2 g 50 mL (2 g Intravenous New Bag/Given 04/09/21 0550)  ? ? ? ?IMPRESSION / MDM / ASSESSMENT AND PLAN / ED COURSE  ?I reviewed the triage vital signs and the nursing notes. ?             ?               ? ?Differential diagnosis includes, but is not limited to headache and nausea and vomiting related to hyperglycemia, DKA, acute infectious gastritis versus other acute viral infection, with low suspicion for meningitis based on patient's initial history and exam or atypical ACS based on ECG. ? ?I did offer patient Compazine or Reglan for headache although she is declining this at this time as it seems she has had some bad anesthesia reactions in the past.  Reports that she cannot take NSAIDs due to her transplant. ? ?While patient is undergoing initial work-up we will give a dose of IV Doxy she was unable to get her oral Doxy picked up earlier today.  She has no new respiratory symptoms but does not otherwise appear septic. ? ?VBG with a pH of 7.38 with a PCO2 of 41, bicarb of 24.3.  CBC with WBC count of 2.6 without evidence of acute anemia and normal platelets.  CMP shows a sodium of 133, glucose of 348 and a creatinine of 1.17 without any other significant electrolyte or metabolic derangements.  No evidence of hepatitis, cholestatic process or acidosis.  Beta hydroxybutyrate is within normal limits at 0.16.  Overall picture is not suggestive of DKA.  Lipase not suggestive of  pancreatitis.  COVID influenza PCR is negative.  Pregnancy test is negative.  UA has some protein and glucose but no ketones or evidence of infection or blood. ? ?On reassessment patient states he is feeling little better and wished to try some Reglan with Benadryl.  We will give this as well as  a small dose sliding scale insulin while she is receiving remainder of her fluids. ? ?Care patient signed over to assuming provider at approximately 0 700.  Plan is to follow-up after she has received her Reglan and reassess.  She is feeling much better I think she can likely be safely discharged with plan for outpatient follow-up. ? ?  ? ? ?FINAL CLINICAL IMPRESSION(S) / ED DIAGNOSES  ? ?Final diagnoses:  ?Hyperglycemia  ?Nonintractable headache, unspecified chronicity pattern, unspecified headache type  ?History of lung transplant ([place])  ?Hypertension, unspecified type  ?Nausea and vomiting, unspecified vomiting type  ? ? ? ?Rx / DC Orders  ? ?ED Discharge Orders   ? ? None  ? ?  ? ? ? ?Note:  This document was prepared using Dragon voice recognition software and may include unintentional dictation errors. ?  ?Lucrezia Starch, MD ?04/09/21 0708 ? ?  ?Lucrezia Starch, MD ?04/09/21 [phone] ? ?

## 2021-04-09 NOTE — ED Triage Notes (Addendum)
Patient ambulatory to triage with steady gait, without difficulty or distress noted; pt reports that she feels as if she is in DKA; c/o hyperglycemia (FSBS 400 and 580 at home), HA, dehydration, nausea; st hx cystic fibrosis, double lung transplant; bronch last wk with [facility] + for staph, and sinus infection; has not p/u her antibiotics yet ?

## 2021-04-09 NOTE — ED Provider Notes (Signed)
Procedures ? ?  ? ?----------------------------------------- ?9:53 AM on 04/09/2021 ?----------------------------------------- ?Vital signs are normal.  Patient feeling much better after Reglan.  He feels ready to go home.  We will provide a prescription for Reglan to take as needed.  She also reports she plans to go to the pharmacy to pick up her prescription of doxycycline that was previously prescribed by her doctor. ? ? ?  ?Carrie Mew, MD ?04/09/21 [phone] ? ?

## 2021-06-20 IMAGING — CT CT HEAD W/O CM
3 of 6 series · 16 of 47 positions shown, 19 images · non-contrast
Comparison: None.

CLINICAL DATA: Acute headache with normal neuro exam

EXAM:
CT HEAD WITHOUT CONTRAST
TECHNIQUE: Contiguous axial images were obtained from the base of the skull
through the vertex without intravenous contrast.

[Series 2: head wo · axial · 0.42mm/px · z∈[-87,+33]mm · 11 of 28 slices shown, 14 images]
[im 2/28  brain]
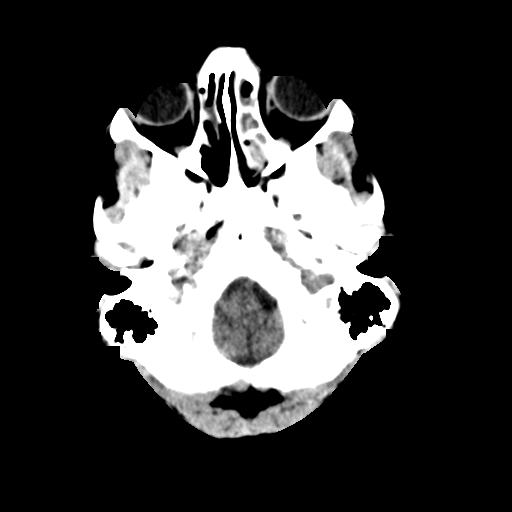
[im 2/28  bone]
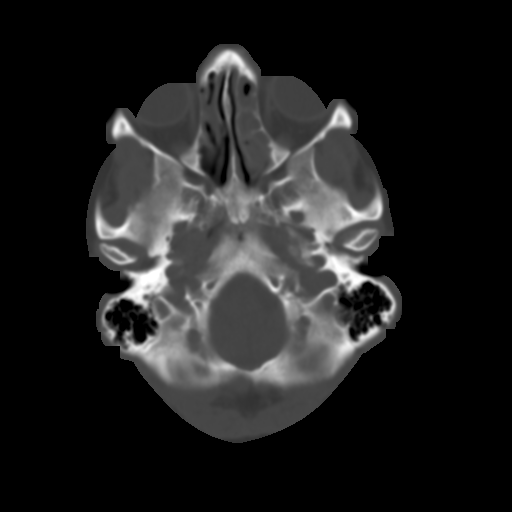
[im 4/28  brain]
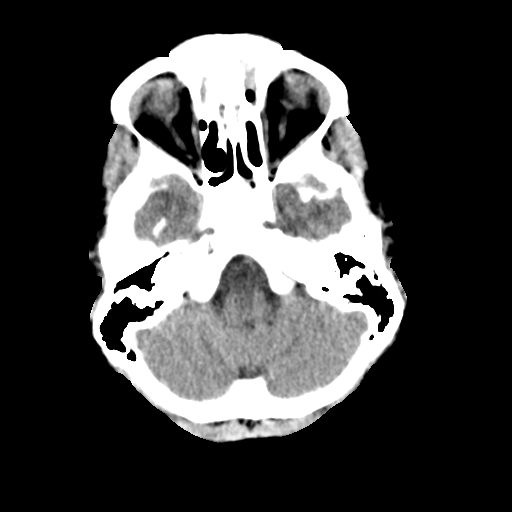
[im 6/28  brain]
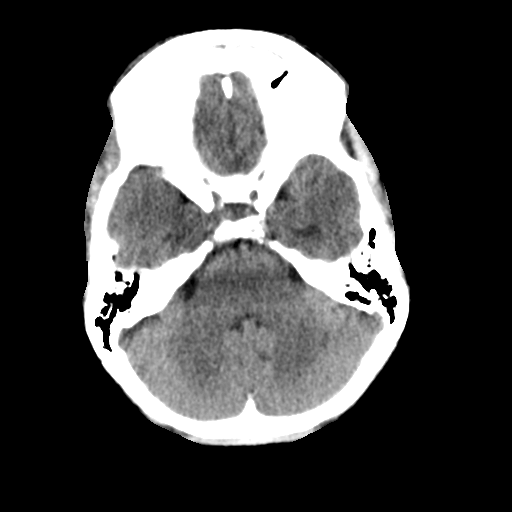
[im 10/28  brain]
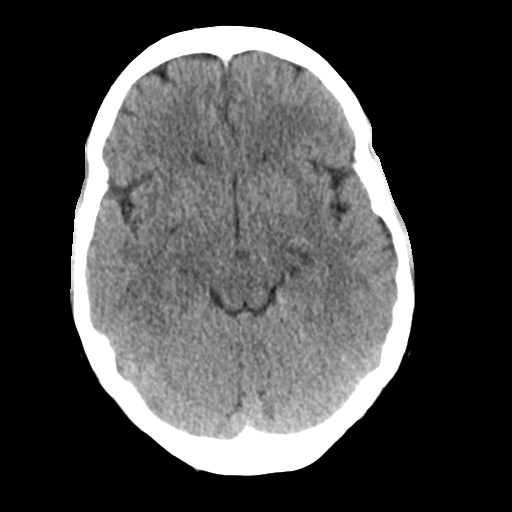
[im 12/28  brain]
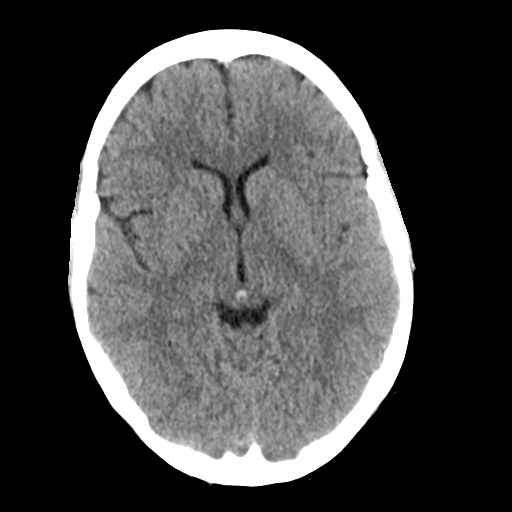
[im 12/28  bone]
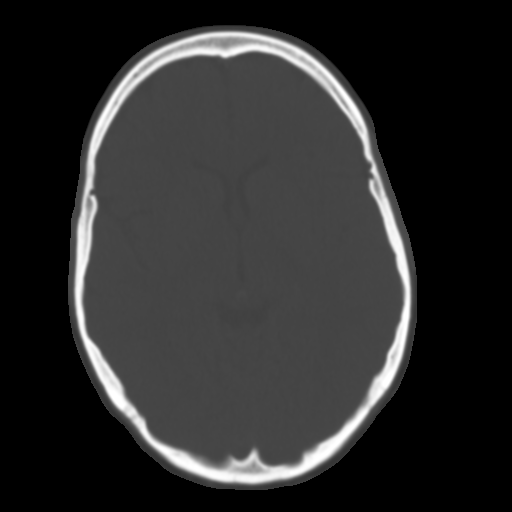
[im 14/28  brain]
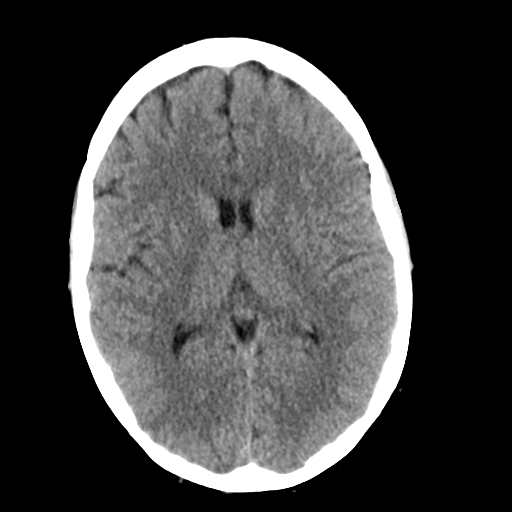
[im 16/28  brain]
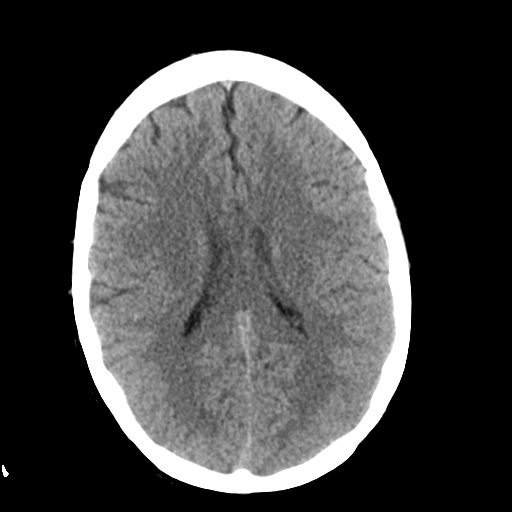
[im 18/28  brain]
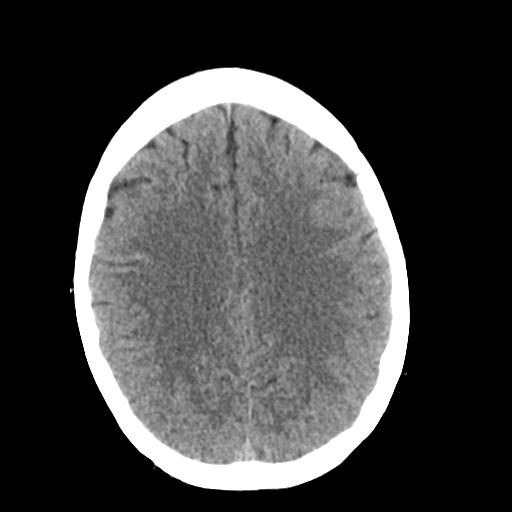
[im 22/28  brain]
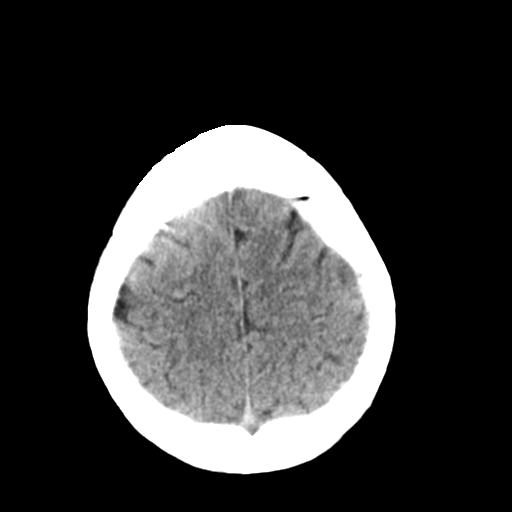
[im 22/28  bone]
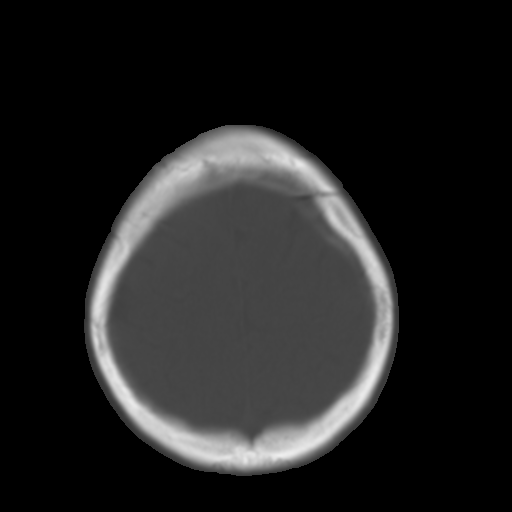
[im 24/28  brain]
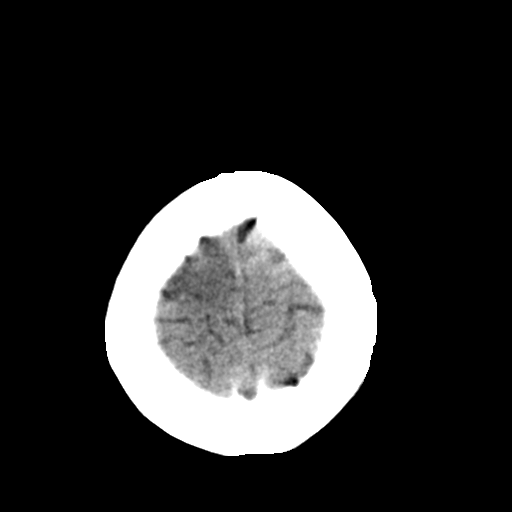
[im 26/28  brain]
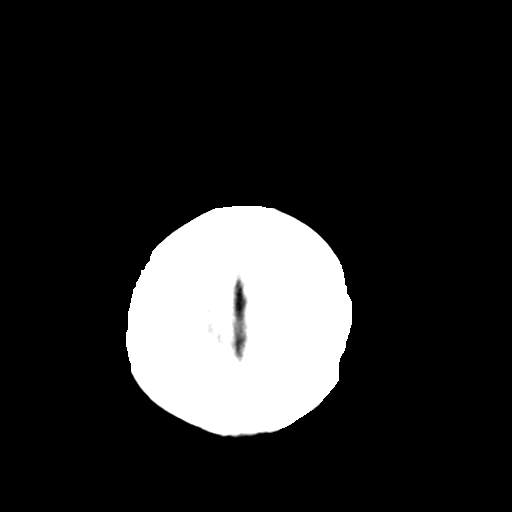

[Series 4: coronal soft tissue · coronal · 0.30mm/px · 3 of 65 slices shown]
[im 17/65  brain]
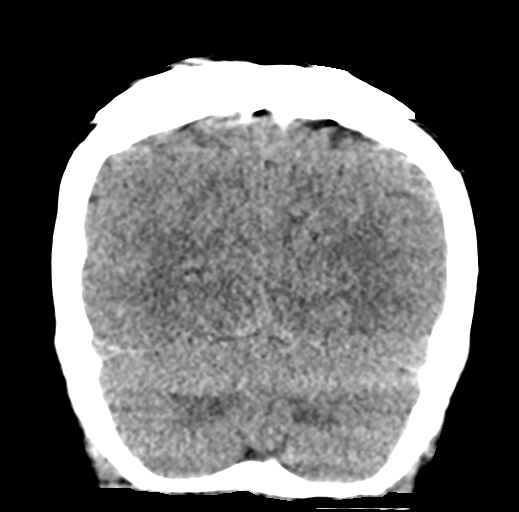
[im 33/65  brain]
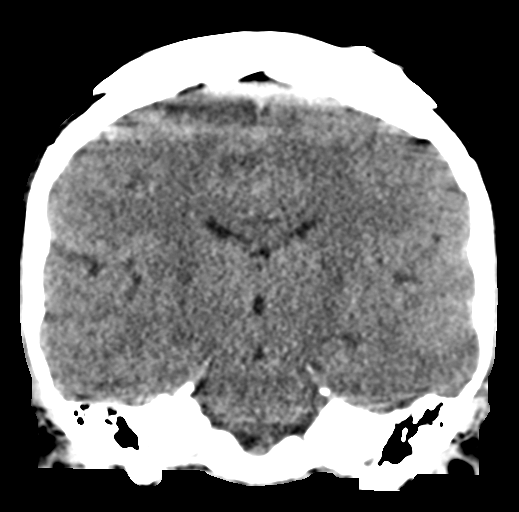
[im 49/65  brain]
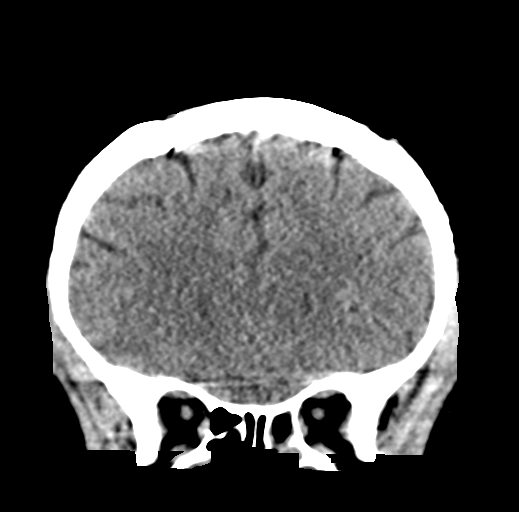

[Series 9: sagittal soft tissue · sagittal · 0.29mm/px · 2 of 49 slices shown]
[im 17/49  brain]
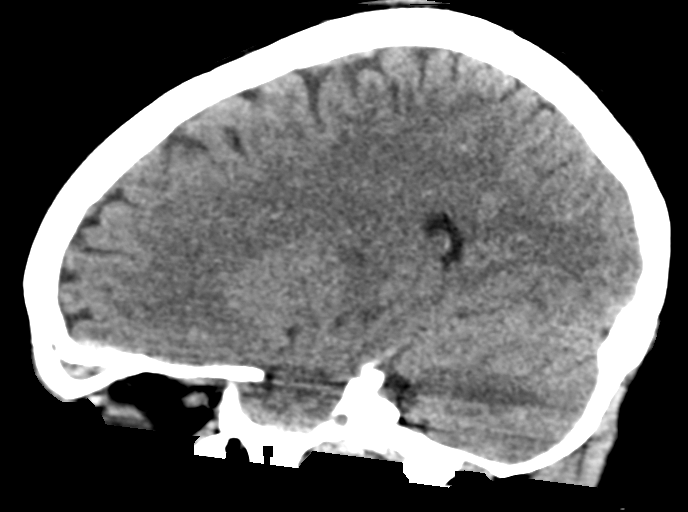
[im 33/49  brain]
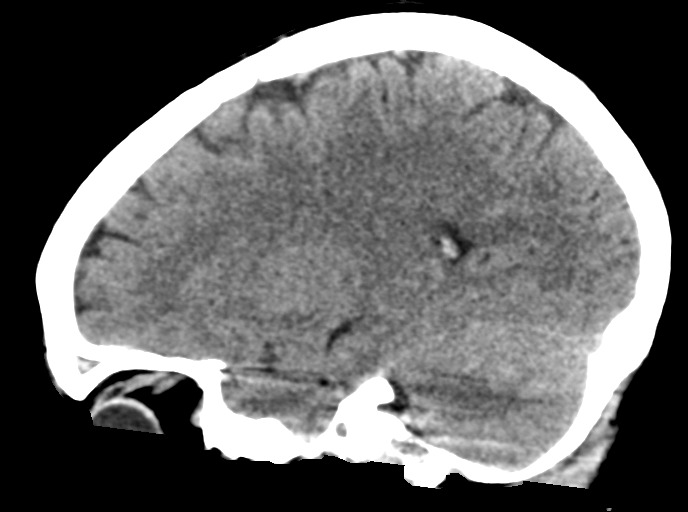

[16 of 47 positions shown; findings below may reference images not displayed]

FINDINGS: Brain: No evidence of acute infarction, hemorrhage, hydrocephalus,
extra-axial collection or mass lesion/mass effect.

Vascular: No hyperdense vessel or unexpected calcification.

Skull: Normal. Negative for fracture or focal lesion.

Sinuses/Orbits: Mucosal thickening in the covered paranasal sinuses.
IMPRESSION: 1. Unremarkable appearance of the brain.
2. Mucosal thickening in the covered paranasal sinuses.

## 2021-09-12 DIAGNOSIS — L905 Scar conditions and fibrosis of skin: Secondary | ICD-10-CM | POA: Diagnosis not present

## 2021-09-12 DIAGNOSIS — B079 Viral wart, unspecified: Secondary | ICD-10-CM | POA: Diagnosis not present

## 2021-09-12 DIAGNOSIS — E781 Pure hyperglyceridemia: Secondary | ICD-10-CM | POA: Diagnosis not present

## 2021-09-12 DIAGNOSIS — L820 Inflamed seborrheic keratosis: Secondary | ICD-10-CM | POA: Diagnosis not present

## 2021-09-12 DIAGNOSIS — Z79899 Other long term (current) drug therapy: Secondary | ICD-10-CM | POA: Diagnosis not present

## 2021-09-17 DIAGNOSIS — B079 Viral wart, unspecified: Secondary | ICD-10-CM | POA: Diagnosis not present

## 2021-09-17 DIAGNOSIS — D849 Immunodeficiency, unspecified: Secondary | ICD-10-CM | POA: Diagnosis not present

## 2021-10-03 DIAGNOSIS — T86818 Other complications of lung transplant: Secondary | ICD-10-CM | POA: Diagnosis not present

## 2021-10-03 DIAGNOSIS — Z5181 Encounter for therapeutic drug level monitoring: Secondary | ICD-10-CM | POA: Diagnosis not present

## 2021-10-03 DIAGNOSIS — Z4824 Encounter for aftercare following lung transplant: Secondary | ICD-10-CM | POA: Diagnosis not present

## 2021-10-03 DIAGNOSIS — Z942 Lung transplant status: Secondary | ICD-10-CM | POA: Diagnosis not present

## 2021-10-03 DIAGNOSIS — Z79899 Other long term (current) drug therapy: Secondary | ICD-10-CM | POA: Diagnosis not present

## 2021-10-16 DIAGNOSIS — R918 Other nonspecific abnormal finding of lung field: Secondary | ICD-10-CM | POA: Diagnosis not present

## 2021-10-16 DIAGNOSIS — Z7969 Long term (current) use of other immunomodulators and immunosuppressants: Secondary | ICD-10-CM | POA: Diagnosis not present

## 2021-10-16 DIAGNOSIS — Z5181 Encounter for therapeutic drug level monitoring: Secondary | ICD-10-CM | POA: Diagnosis not present

## 2021-10-16 DIAGNOSIS — K219 Gastro-esophageal reflux disease without esophagitis: Secondary | ICD-10-CM | POA: Diagnosis not present

## 2021-10-16 DIAGNOSIS — Z79899 Other long term (current) drug therapy: Secondary | ICD-10-CM | POA: Diagnosis not present

## 2021-10-16 DIAGNOSIS — Z4824 Encounter for aftercare following lung transplant: Secondary | ICD-10-CM | POA: Diagnosis not present

## 2021-10-16 DIAGNOSIS — D84821 Immunodeficiency due to drugs: Secondary | ICD-10-CM | POA: Diagnosis not present

## 2021-10-16 DIAGNOSIS — D849 Immunodeficiency, unspecified: Secondary | ICD-10-CM | POA: Diagnosis not present

## 2021-10-16 DIAGNOSIS — T86818 Other complications of lung transplant: Secondary | ICD-10-CM | POA: Diagnosis not present

## 2021-10-16 DIAGNOSIS — Z942 Lung transplant status: Secondary | ICD-10-CM | POA: Diagnosis not present

## 2021-10-16 DIAGNOSIS — E781 Pure hyperglyceridemia: Secondary | ICD-10-CM | POA: Diagnosis not present

## 2021-10-17 DIAGNOSIS — D849 Immunodeficiency, unspecified: Secondary | ICD-10-CM | POA: Diagnosis not present

## 2021-10-17 DIAGNOSIS — B079 Viral wart, unspecified: Secondary | ICD-10-CM | POA: Diagnosis not present

## 2021-11-27 DIAGNOSIS — E089 Diabetes mellitus due to underlying condition without complications: Secondary | ICD-10-CM | POA: Diagnosis not present

## 2021-11-28 DIAGNOSIS — E089 Diabetes mellitus due to underlying condition without complications: Secondary | ICD-10-CM | POA: Diagnosis not present

## 2022-01-16 DIAGNOSIS — E781 Pure hyperglyceridemia: Secondary | ICD-10-CM | POA: Diagnosis not present

## 2022-01-16 DIAGNOSIS — Z79899 Other long term (current) drug therapy: Secondary | ICD-10-CM | POA: Diagnosis not present

## 2022-01-16 DIAGNOSIS — B079 Viral wart, unspecified: Secondary | ICD-10-CM | POA: Diagnosis not present

## 2022-02-24 DIAGNOSIS — Z5181 Encounter for therapeutic drug level monitoring: Secondary | ICD-10-CM | POA: Diagnosis not present

## 2022-02-24 DIAGNOSIS — T86818 Other complications of lung transplant: Secondary | ICD-10-CM | POA: Diagnosis not present

## 2022-02-24 DIAGNOSIS — Z4824 Encounter for aftercare following lung transplant: Secondary | ICD-10-CM | POA: Diagnosis not present

## 2022-02-24 DIAGNOSIS — Z79899 Other long term (current) drug therapy: Secondary | ICD-10-CM | POA: Diagnosis not present

## 2022-02-24 DIAGNOSIS — Z942 Lung transplant status: Secondary | ICD-10-CM | POA: Diagnosis not present

## 2022-02-26 DIAGNOSIS — Z794 Long term (current) use of insulin: Secondary | ICD-10-CM | POA: Diagnosis not present

## 2022-02-26 DIAGNOSIS — E781 Pure hyperglyceridemia: Secondary | ICD-10-CM | POA: Diagnosis not present

## 2022-02-26 DIAGNOSIS — E089 Diabetes mellitus due to underlying condition without complications: Secondary | ICD-10-CM | POA: Diagnosis not present

## 2022-02-26 DIAGNOSIS — Z7952 Long term (current) use of systemic steroids: Secondary | ICD-10-CM | POA: Diagnosis not present

## 2022-02-26 DIAGNOSIS — Z79899 Other long term (current) drug therapy: Secondary | ICD-10-CM | POA: Diagnosis not present

## 2022-03-09 ENCOUNTER — Emergency Department: Payer: 59

## 2022-03-09 ENCOUNTER — Emergency Department
Admission: EM | Admit: 2022-03-09 | Discharge: 2022-03-09 | Disposition: A | Discharge status: Home / Self Care | Payer: 59 | Attending: Emergency Medicine | Admitting: Emergency Medicine

## 2022-03-09 ENCOUNTER — Other Ambulatory Visit: Payer: Self-pay

## 2022-03-09 DIAGNOSIS — R9431 Abnormal electrocardiogram [ECG] [EKG]: Secondary | ICD-10-CM | POA: Diagnosis not present

## 2022-03-09 DIAGNOSIS — G43909 Migraine, unspecified, not intractable, without status migrainosus: Secondary | ICD-10-CM | POA: Insufficient documentation

## 2022-03-09 DIAGNOSIS — G4489 Other headache syndrome: Secondary | ICD-10-CM | POA: Diagnosis not present

## 2022-03-09 DIAGNOSIS — R110 Nausea: Secondary | ICD-10-CM | POA: Diagnosis not present

## 2022-03-09 DIAGNOSIS — R059 Cough, unspecified: Secondary | ICD-10-CM | POA: Diagnosis not present

## 2022-03-09 DIAGNOSIS — R197 Diarrhea, unspecified: Secondary | ICD-10-CM | POA: Diagnosis not present

## 2022-03-09 DIAGNOSIS — E1122 Type 2 diabetes mellitus with diabetic chronic kidney disease: Secondary | ICD-10-CM | POA: Insufficient documentation

## 2022-03-09 DIAGNOSIS — N182 Chronic kidney disease, stage 2 (mild): Secondary | ICD-10-CM | POA: Diagnosis not present

## 2022-03-09 DIAGNOSIS — R1111 Vomiting without nausea: Secondary | ICD-10-CM | POA: Diagnosis not present

## 2022-03-09 DIAGNOSIS — Z20822 Contact with and (suspected) exposure to covid-19: Secondary | ICD-10-CM | POA: Diagnosis not present

## 2022-03-09 DIAGNOSIS — R112 Nausea with vomiting, unspecified: Secondary | ICD-10-CM | POA: Diagnosis not present

## 2022-03-09 LAB — CBC WITH DIFFERENTIAL/PLATELET
Abs Immature Granulocytes: 0.02 10*3/uL (ref 0.00–0.07)
Basophils Absolute: 0 10*3/uL (ref 0.0–0.1)
Basophils Relative: 0 %
Eosinophils Absolute: 0.1 10*3/uL (ref 0.0–0.5)
Eosinophils Relative: 2 %
HCT: 36.1 % (ref 36.0–46.0)
Hemoglobin: 12.5 g/dL (ref 12.0–15.0)
Immature Granulocytes: 1 %
Lymphocytes Relative: 24 %
Lymphs Abs: 1 10*3/uL (ref 0.7–4.0)
MCH: 30.5 pg (ref 26.0–34.0)
MCHC: 34.6 g/dL (ref 30.0–36.0)
MCV: 88 fL (ref 80.0–100.0)
Monocytes Absolute: 0.4 10*3/uL (ref 0.1–1.0)
Monocytes Relative: 9 %
Neutro Abs: 2.7 10*3/uL (ref 1.7–7.7)
Neutrophils Relative %: 64 %
Platelets: 177 10*3/uL (ref 150–400)
RBC: 4.1 MIL/uL (ref 3.87–5.11)
RDW: 13.3 % (ref 11.5–15.5)
WBC: 4.2 10*3/uL (ref 4.0–10.5)
nRBC: 0 % (ref 0.0–0.2)

## 2022-03-09 LAB — COMPREHENSIVE METABOLIC PANEL
ALT: 39 U/L (ref 0–44)
AST: 34 U/L (ref 15–41)
Albumin: 4.6 g/dL (ref 3.5–5.0)
Alkaline Phosphatase: 64 U/L (ref 38–126)
Anion gap: 10 (ref 5–15)
BUN: 19 mg/dL (ref 6–20)
CO2: 25 mmol/L (ref 22–32)
Calcium: 9 mg/dL (ref 8.9–10.3)
Chloride: 99 mmol/L (ref 98–111)
Creatinine, Ser: 0.91 mg/dL (ref 0.44–1.00)
GFR, Estimated: >60 mL/min (ref >60)
Glucose, Bld: 263 mg/dL — ABNORMAL HIGH (ref 70–99)
Potassium: 3.7 mmol/L (ref 3.5–5.1)
Sodium: 134 mmol/L — ABNORMAL LOW (ref 135–145)
Total Bilirubin: 1.9 mg/dL — ABNORMAL HIGH (ref 0.3–1.2)
Total Protein: 7.4 g/dL (ref 6.5–8.1)

## 2022-03-09 LAB — URINALYSIS, ROUTINE W REFLEX MICROSCOPIC
Bacteria, UA: NONE SEEN
Bilirubin Urine: NEGATIVE
Glucose, UA: >=500 mg/dL — AB
Hgb urine dipstick: NEGATIVE
Ketones, ur: NEGATIVE mg/dL
Leukocytes,Ua: NEGATIVE
Nitrite: NEGATIVE
Protein, ur: 100 mg/dL — AB
Specific Gravity, Urine: 1.016 (ref 1.005–1.030)
pH: 7 (ref 5.0–8.0)

## 2022-03-09 LAB — RESP PANEL BY RT-PCR (RSV, FLU A&B, COVID)  RVPGX2
Influenza A by PCR: NEGATIVE
Influenza B by PCR: NEGATIVE
Resp Syncytial Virus by PCR: NEGATIVE
SARS Coronavirus 2 by RT PCR: NEGATIVE

## 2022-03-09 LAB — LIPASE, BLOOD: Lipase: 22 U/L (ref 11–51)

## 2022-03-09 LAB — CBG MONITORING, ED: Glucose-Capillary: 230 mg/dL — ABNORMAL HIGH (ref 70–99)

## 2022-03-09 MED ORDER — SODIUM CHLORIDE 0.9 % IV BOLUS
1000.0000 mL | Freq: Once | INTRAVENOUS | Status: AC
  Administered 2022-03-09: 1000 mL via INTRAVENOUS

## 2022-03-09 MED ORDER — ONDANSETRON HCL 4 MG/2ML IJ SOLN
4.0000 mg | Freq: Once | INTRAMUSCULAR | Status: AC
  Administered 2022-03-09: 4 mg via INTRAVENOUS
  Filled 2022-03-09: qty 2

## 2022-03-09 MED ORDER — METOCLOPRAMIDE HCL 5 MG/ML IJ SOLN
10.0000 mg | Freq: Once | INTRAMUSCULAR | Status: AC
  Administered 2022-03-09: 10 mg via INTRAVENOUS
  Filled 2022-03-09: qty 2

## 2022-03-09 MED ORDER — MAGNESIUM SULFATE 2 GM/50ML IV SOLN
2.0000 g | Freq: Once | INTRAVENOUS | Status: AC
  Administered 2022-03-09: 2 g via INTRAVENOUS
  Filled 2022-03-09: qty 50

## 2022-03-09 MED ORDER — DIPHENHYDRAMINE HCL 50 MG/ML IJ SOLN
25.0000 mg | Freq: Once | INTRAMUSCULAR | Status: AC
  Administered 2022-03-09: 25 mg via INTRAVENOUS
  Filled 2022-03-09: qty 1

## 2022-03-09 NOTE — ED Notes (Signed)
Patient transported to X-ray 

## 2022-03-09 NOTE — Discharge Instructions (Signed)
Your blood work was normal.  Please follow-up with your outpatient provider.  Please return for any new, worsening, or change in symptoms or other concerns.  It was a pleasure caring for you today.

## 2022-03-09 NOTE — ED Triage Notes (Signed)
Pt arrives via EMS for N/V with migraine since 0200. Pt has hx of migraines and takes Imitrex, but has been unable to keep it down.

## 2022-03-09 NOTE — ED Provider Notes (Signed)
[facility] Provider Note    Event Date/Time   First MD Initiated Contact with Patient 03/09/22 0901     (approximate)   History   Nausea, Migraine, and Emesis   HPI  Stacy Oconnor is a 37 y.o. 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  Her symptoms are the same as her previous migraine headaches.  She reports that her headache is in the same location.  No visual changes.  Her husband at bedside reports that she gets these migraines at least once per week and her presentation today appears the same to him as her previous presentations.  She has not had a fever.  She does note that she has been weaned off of her gabapentin, 3 weeks ago, and thinks that this is attributing to her headaches.  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   Vitamin D deficiency, unspecified 08/18/2012   Immunosuppression ([place]) 08/06/2012   Anxiety 07/25/2012   Lung transplant status ([place]) 07/18/2012   History of cholelithiasis 07/03/2012   Cystic fibrosis of the lung ([place]) 06/22/2012   Diabetes mellitus ([place]) 05/29/2011   Gastro-esophageal reflux disease without esophagitis 02/19/2010          Physical Exam   Triage Vital Signs: ED Triage Vitals  Enc Vitals Group     BP 03/09/22 0905 (!) 181/106     Pulse Rate 03/09/22 0905 (!) 58     Resp 03/09/22 0905 20     Temp 03/09/22 0907 97.9 F (36.6 C)     Temp Source 03/09/22 0907 Oral     SpO2 03/09/22 0905 99 %      Weight 03/09/22 0903 118 lb (53.5 kg)     Height 03/09/22 0903 5' 6"$  (1.676 m)     Head Circumference --      Peak Flow --      Pain Score --      Pain Loc --      Pain Edu? --      Excl. in [place]? --     Most recent vital signs: Vitals:   03/09/22 1200 03/09/22 1300  BP: 131/79 130/88  Pulse: 65 68  Resp:    Temp:    SpO2: 100% 98%    Physical Exam Vitals and nursing note reviewed.  Constitutional:      General: Awake and alert. No acute distress.    Appearance: Normal appearance. The patient is normal weight.  HENT:     Head: Normocephalic and atraumatic.     Mouth: Mucous membranes are moist.  Eyes:     General: PERRL. Normal EOMs        Right eye: No discharge.        Left eye: No discharge.     Conjunctiva/sclera: Conjunctivae normal.  Cardiovascular:     Rate and Rhythm: Normal rate and regular rhythm.     Pulses: Normal pulses.  Pulmonary:     Effort: Pulmonary effort is normal. No respiratory distress.  Breath sounds: Normal breath sounds.  Abdominal:     Abdomen is soft. There is no abdominal tenderness. No rebound or guarding. No distention. Musculoskeletal:        General: No swelling. Normal range of motion.     Cervical back: Normal range of motion and neck supple.  Skin:    General: Skin is warm and dry.     Capillary Refill: Capillary refill takes less than 2 seconds.     Findings: No rash.  Neurological:     Mental Status: The patient is awake and alert.   Neurological: GCS 15 alert and oriented x3 Normal speech, no expressive or receptive aphasia or dysarthria Cranial nerves II through XII intact Normal visual fields 5 out of 5 strength in all 4 extremities with intact sensation throughout No extremity drift Normal finger-to-nose testing, no limb or truncal ataxia    ED Results / Procedures / Treatments   Labs (all labs ordered are listed, but only abnormal results are displayed) Labs Reviewed  COMPREHENSIVE METABOLIC PANEL -  Abnormal; Notable for the following components:      Result Value   Sodium 134 (*)    Glucose, Bld 263 (*)    Total Bilirubin 1.9 (*)    All other components within normal limits  URINALYSIS, ROUTINE W REFLEX MICROSCOPIC - Abnormal; Notable for the following components:   Color, Urine YELLOW (*)    APPearance CLEAR (*)    Glucose, UA >=500 (*)    Protein, ur 100 (*)    All other components within normal limits  CBG MONITORING, ED - Abnormal; Notable for the following components:   Glucose-Capillary 230 (*)    All other components within normal limits  RESP PANEL BY RT-PCR (RSV, FLU A&B, COVID)  [id number]  CBC WITH DIFFERENTIAL/PLATELET  LIPASE, BLOOD  POC URINE PREG, ED     EKG     RADIOLOGY I independently reviewed and interpreted imaging and agree with radiologists findings.     PROCEDURES:  Critical Care performed:   Procedures   MEDICATIONS ORDERED IN ED: Medications  ondansetron (ZOFRAN) injection 4 mg (4 mg Intravenous Given 03/09/22 0921)  sodium chloride 0.9 % bolus 1,000 mL (0 mLs Intravenous Stopped 03/09/22 1141)  metoCLOPramide (REGLAN) injection 10 mg (10 mg Intravenous Given 03/09/22 1003)  diphenhydrAMINE (BENADRYL) injection 25 mg (25 mg Intravenous Given 03/09/22 1003)  magnesium sulfate IVPB 2 g 50 mL (0 g Intravenous Stopped 03/09/22 1246)     IMPRESSION / MDM / ASSESSMENT AND PLAN / ED COURSE  I reviewed the triage vital signs and the nursing notes.   Differential diagnosis includes, but is not limited to, migraine headache, hyperglycemia, DKA, gastroenteritis.  I have a low suspicion for meningitis or acute intracranial hemorrhage given that she reports that this headache feels the same as previous headaches.  I reviewed the patient's chart.  Patient was seen in the emergency department in March 2023 at which time she had similar complaints.  She was treated with Reglan, Benadryl, fluids, Zofran, and magnesium with improvement of her symptoms.  She  had a CAT scan of her head in June 2021 which was normal obtained because she had a similar presenting complaint of migraine headache.  Her headache was gradual in onset, without history or physical exam findings to suggest encephalopathy; no altered mental status, fever or meningismus, vision changes, or focal neurological deficit and improved with treatment in the emergency department. Therefore, I have low suspicion for concerning process that would  require urgent or emergent imaging or diagnostic/therapeutic procedural intervention such as lumbar puncture. Doubt meningitis as there is no fever, photophobia, neck symptoms, altered mental status. Additionally the patient is not known to be immunocompromised. No history of trauma, doubt subdural or epidural hematoma. No dizziness or other neurologic symptoms so cerebellar infarction or other hemorrhagic stroke are unlikely. Intracranial mass unlikely given that the headache is not getting progressively worse, is not worse in the morning, there are no other neurologic symptoms, and the neurologic exam is grossly normal. Unlikely to be giant cell arteritis as there is no tenderness over temporal artery or vision changes. Doubt CO toxicity as no known exposure and no other family members have a headache. No neck pain and was not sudden onset or associated with movement of the neck and no dizziness, doubt carotid artery dissection. No occipital tenderness so occipital neuralgia seems less likely.  She has had multiple similar presentations in the past.  Her abdomen is soft and nontender, she has not been complaining of any abdominal pain, I do not suspect intra-abdominal etiology.  Discussed the option of obtaining advanced imaging, however given that this is the same as previous visits, no indication for repeat imaging today.    Patient was treated symptomatically with Zofran, Reglan (after EKG obtained and demonstrated normal Qtc), magnesium, Benadryl, and IV  fluids similar to her previous presentations, with significant improvement of her symptoms.  Patient feels comfortable with discharge home at this time.  Her husband reports that this is consistent with her previous episodes which happen at least once a week.  Return precautions discussed, patient to follow-up closely with outpatient provider.  Patient and husband understand and agree with plan.  She was discharged in stable condition.   Patient's presentation is most consistent with acute complicated illness / injury requiring diagnostic workup.   Clinical Course as of 03/09/22 1443  Sun Mar 09, 2022  1027 Patient reports that she feels improved [JP]  1137 Wants to try "one more thing" before discharge. Per chart review, she had magnesium last time [JP]  1257 Patient reports that she feels significantly improved and would like to be discharged [JP]    Clinical Course User Index [JP] Curley Fayette, Clarnce Flock, PA-C     FINAL CLINICAL IMPRESSION(S) / ED DIAGNOSES   Final diagnoses:  Nausea vomiting and diarrhea  Migraine without status migrainosus, not intractable, unspecified migraine type     Rx / DC Orders   ED Discharge Orders     None        Note:  This document was prepared using Dragon voice recognition software and may include unintentional dictation errors.   Marquette Old, PA-C 03/09/22 1443    Vanessa Tradewinds, MD 03/10/22 [phone]

## 2022-03-09 NOTE — ED Notes (Signed)
Neg POC pregnancy test

## 2022-03-11 DIAGNOSIS — N182 Chronic kidney disease, stage 2 (mild): Secondary | ICD-10-CM | POA: Diagnosis not present

## 2022-03-11 DIAGNOSIS — F32A Depression, unspecified: Secondary | ICD-10-CM | POA: Diagnosis not present

## 2022-03-11 DIAGNOSIS — Z01818 Encounter for other preprocedural examination: Secondary | ICD-10-CM | POA: Diagnosis not present

## 2022-03-11 DIAGNOSIS — Z934 Other artificial openings of gastrointestinal tract status: Secondary | ICD-10-CM | POA: Diagnosis not present

## 2022-03-11 DIAGNOSIS — Z7951 Long term (current) use of inhaled steroids: Secondary | ICD-10-CM | POA: Diagnosis not present

## 2022-03-11 DIAGNOSIS — Z794 Long term (current) use of insulin: Secondary | ICD-10-CM | POA: Diagnosis not present

## 2022-03-11 DIAGNOSIS — E089 Diabetes mellitus due to underlying condition without complications: Secondary | ICD-10-CM | POA: Diagnosis not present

## 2022-03-11 DIAGNOSIS — G43909 Migraine, unspecified, not intractable, without status migrainosus: Secondary | ICD-10-CM | POA: Diagnosis not present

## 2022-03-11 DIAGNOSIS — T451X5A Adverse effect of antineoplastic and immunosuppressive drugs, initial encounter: Secondary | ICD-10-CM | POA: Diagnosis not present

## 2022-03-11 DIAGNOSIS — K388 Other specified diseases of appendix: Secondary | ICD-10-CM | POA: Diagnosis not present

## 2022-03-11 DIAGNOSIS — E781 Pure hyperglyceridemia: Secondary | ICD-10-CM | POA: Diagnosis not present

## 2022-03-11 DIAGNOSIS — Z4824 Encounter for aftercare following lung transplant: Secondary | ICD-10-CM | POA: Diagnosis not present

## 2022-03-11 DIAGNOSIS — T86819 Unspecified complication of lung transplant: Secondary | ICD-10-CM | POA: Diagnosis not present

## 2022-03-11 DIAGNOSIS — B079 Viral wart, unspecified: Secondary | ICD-10-CM | POA: Diagnosis not present

## 2022-03-11 DIAGNOSIS — M549 Dorsalgia, unspecified: Secondary | ICD-10-CM | POA: Diagnosis not present

## 2022-03-11 DIAGNOSIS — M791 Myalgia, unspecified site: Secondary | ICD-10-CM | POA: Diagnosis not present

## 2022-03-11 DIAGNOSIS — K219 Gastro-esophageal reflux disease without esophagitis: Secondary | ICD-10-CM | POA: Diagnosis not present

## 2022-03-11 DIAGNOSIS — R112 Nausea with vomiting, unspecified: Secondary | ICD-10-CM | POA: Diagnosis not present

## 2022-03-11 DIAGNOSIS — K8689 Other specified diseases of pancreas: Secondary | ICD-10-CM | POA: Diagnosis not present

## 2022-03-11 DIAGNOSIS — D849 Immunodeficiency, unspecified: Secondary | ICD-10-CM | POA: Diagnosis not present

## 2022-03-11 DIAGNOSIS — D84821 Immunodeficiency due to drugs: Secondary | ICD-10-CM | POA: Diagnosis not present

## 2022-03-11 DIAGNOSIS — D702 Other drug-induced agranulocytosis: Secondary | ICD-10-CM | POA: Diagnosis not present

## 2022-03-11 DIAGNOSIS — K862 Cyst of pancreas: Secondary | ICD-10-CM | POA: Diagnosis not present

## 2022-03-11 DIAGNOSIS — K6389 Other specified diseases of intestine: Secondary | ICD-10-CM | POA: Diagnosis not present

## 2022-03-11 DIAGNOSIS — R1011 Right upper quadrant pain: Secondary | ICD-10-CM | POA: Diagnosis not present

## 2022-03-11 DIAGNOSIS — Z86718 Personal history of other venous thrombosis and embolism: Secondary | ICD-10-CM | POA: Diagnosis not present

## 2022-03-11 DIAGNOSIS — K8020 Calculus of gallbladder without cholecystitis without obstruction: Secondary | ICD-10-CM | POA: Diagnosis not present

## 2022-03-11 DIAGNOSIS — N179 Acute kidney failure, unspecified: Secondary | ICD-10-CM | POA: Diagnosis not present

## 2022-03-11 DIAGNOSIS — K828 Other specified diseases of gallbladder: Secondary | ICD-10-CM | POA: Diagnosis not present

## 2022-03-11 DIAGNOSIS — D509 Iron deficiency anemia, unspecified: Secondary | ICD-10-CM | POA: Diagnosis not present

## 2022-03-11 DIAGNOSIS — Z942 Lung transplant status: Secondary | ICD-10-CM | POA: Diagnosis not present

## 2022-03-11 DIAGNOSIS — Z86711 Personal history of pulmonary embolism: Secondary | ICD-10-CM | POA: Diagnosis not present

## 2022-03-11 DIAGNOSIS — L299 Pruritus, unspecified: Secondary | ICD-10-CM | POA: Diagnosis not present

## 2022-03-11 DIAGNOSIS — T86818 Other complications of lung transplant: Secondary | ICD-10-CM | POA: Diagnosis not present

## 2022-03-12 DIAGNOSIS — K8020 Calculus of gallbladder without cholecystitis without obstruction: Secondary | ICD-10-CM | POA: Diagnosis not present

## 2022-03-12 DIAGNOSIS — K388 Other specified diseases of appendix: Secondary | ICD-10-CM | POA: Diagnosis not present

## 2022-03-12 DIAGNOSIS — M549 Dorsalgia, unspecified: Secondary | ICD-10-CM | POA: Diagnosis not present

## 2022-03-12 DIAGNOSIS — K6389 Other specified diseases of intestine: Secondary | ICD-10-CM | POA: Diagnosis not present

## 2022-03-12 DIAGNOSIS — M791 Myalgia, unspecified site: Secondary | ICD-10-CM | POA: Diagnosis not present

## 2022-03-12 DIAGNOSIS — R1011 Right upper quadrant pain: Secondary | ICD-10-CM | POA: Diagnosis not present

## 2022-03-12 DIAGNOSIS — R112 Nausea with vomiting, unspecified: Secondary | ICD-10-CM | POA: Diagnosis not present

## 2022-03-13 DIAGNOSIS — R1011 Right upper quadrant pain: Secondary | ICD-10-CM | POA: Diagnosis not present

## 2022-03-13 DIAGNOSIS — K6389 Other specified diseases of intestine: Secondary | ICD-10-CM | POA: Diagnosis not present

## 2022-03-13 DIAGNOSIS — K388 Other specified diseases of appendix: Secondary | ICD-10-CM | POA: Diagnosis not present

## 2022-03-14 DIAGNOSIS — K8689 Other specified diseases of pancreas: Secondary | ICD-10-CM | POA: Diagnosis not present

## 2022-03-14 DIAGNOSIS — K862 Cyst of pancreas: Secondary | ICD-10-CM | POA: Diagnosis not present

## 2022-03-15 DIAGNOSIS — R1011 Right upper quadrant pain: Secondary | ICD-10-CM | POA: Diagnosis not present

## 2022-03-15 DIAGNOSIS — Z942 Lung transplant status: Secondary | ICD-10-CM | POA: Diagnosis not present

## 2022-03-15 DIAGNOSIS — Z4824 Encounter for aftercare following lung transplant: Secondary | ICD-10-CM | POA: Diagnosis not present

## 2022-03-15 DIAGNOSIS — T86818 Other complications of lung transplant: Secondary | ICD-10-CM | POA: Diagnosis not present

## 2022-03-16 DIAGNOSIS — T86818 Other complications of lung transplant: Secondary | ICD-10-CM | POA: Diagnosis not present

## 2022-03-16 DIAGNOSIS — Z4824 Encounter for aftercare following lung transplant: Secondary | ICD-10-CM | POA: Diagnosis not present

## 2022-03-16 DIAGNOSIS — Z942 Lung transplant status: Secondary | ICD-10-CM | POA: Diagnosis not present

## 2022-03-16 DIAGNOSIS — R1011 Right upper quadrant pain: Secondary | ICD-10-CM | POA: Diagnosis not present

## 2022-03-17 ENCOUNTER — Telehealth: Payer: Self-pay

## 2022-03-17 DIAGNOSIS — T86819 Unspecified complication of lung transplant: Secondary | ICD-10-CM | POA: Diagnosis not present

## 2022-03-17 DIAGNOSIS — R1011 Right upper quadrant pain: Secondary | ICD-10-CM | POA: Diagnosis not present

## 2022-03-17 DIAGNOSIS — D849 Immunodeficiency, unspecified: Secondary | ICD-10-CM | POA: Diagnosis not present

## 2022-03-17 NOTE — Telephone Encounter (Signed)
        Patient  visited [facility] on 03/09/2022  for Nausea  Migraine  Emesis.   Telephone encounter attempt :  1st  A HIPAA compliant voice message was left requesting a return call.  Instructed patient to call back at [phone].   [place] Resource Care Guide   [email or web address]  ?? [id number]   Website: [email or web address]  [email or web address]

## 2022-03-18 ENCOUNTER — Encounter: Payer: Self-pay | Admitting: *Deleted

## 2022-03-18 ENCOUNTER — Telehealth: Payer: Self-pay

## 2022-03-18 NOTE — Telephone Encounter (Signed)
This encounter was created in error - please disregard.

## 2022-03-18 NOTE — Telephone Encounter (Signed)
        Patient  visited [facility] on 03/09/2022  for Nausea  Migraine  Emesis.   Telephone encounter attempt :  2nd  A HIPAA compliant voice message was left requesting a return call.  Instructed patient to call back at [phone].   [place] Resource Care Guide   [email or web address]  ?? [id number]   Website: [email or web address]  [email or web address]

## 2022-03-19 ENCOUNTER — Telehealth: Payer: Self-pay

## 2022-03-19 NOTE — Telephone Encounter (Signed)
        Patient  visited [facility] on 03/09/2022  for Nausea  Migraine  Emesis.   Telephone encounter attempt :  3rd  A HIPAA compliant voice message was left requesting a return call.  Instructed patient to call back at [phone].   [place] Resource Care Guide   [email or web address]  ?? [id number]   Website: [email or web address]  [email or web address]

## 2022-03-20 DIAGNOSIS — Z79899 Other long term (current) drug therapy: Secondary | ICD-10-CM | POA: Diagnosis not present

## 2022-03-20 DIAGNOSIS — B079 Viral wart, unspecified: Secondary | ICD-10-CM | POA: Diagnosis not present

## 2022-03-24 DIAGNOSIS — Z79899 Other long term (current) drug therapy: Secondary | ICD-10-CM | POA: Diagnosis not present

## 2022-03-24 DIAGNOSIS — Z942 Lung transplant status: Secondary | ICD-10-CM | POA: Diagnosis not present

## 2022-03-24 DIAGNOSIS — T86818 Other complications of lung transplant: Secondary | ICD-10-CM | POA: Diagnosis not present

## 2022-03-24 DIAGNOSIS — Z4824 Encounter for aftercare following lung transplant: Secondary | ICD-10-CM | POA: Diagnosis not present

## 2022-03-24 DIAGNOSIS — Z5181 Encounter for therapeutic drug level monitoring: Secondary | ICD-10-CM | POA: Diagnosis not present

## 2022-03-25 DIAGNOSIS — Z4824 Encounter for aftercare following lung transplant: Secondary | ICD-10-CM | POA: Diagnosis not present

## 2022-03-25 DIAGNOSIS — Z79899 Other long term (current) drug therapy: Secondary | ICD-10-CM | POA: Diagnosis not present

## 2022-03-25 DIAGNOSIS — E559 Vitamin D deficiency, unspecified: Secondary | ICD-10-CM | POA: Diagnosis not present

## 2022-03-25 DIAGNOSIS — K219 Gastro-esophageal reflux disease without esophagitis: Secondary | ICD-10-CM | POA: Diagnosis not present

## 2022-03-25 DIAGNOSIS — Z5181 Encounter for therapeutic drug level monitoring: Secondary | ICD-10-CM | POA: Diagnosis not present

## 2022-03-25 DIAGNOSIS — N210 Calculus in bladder: Secondary | ICD-10-CM | POA: Diagnosis not present

## 2022-03-25 DIAGNOSIS — T86818 Other complications of lung transplant: Secondary | ICD-10-CM | POA: Diagnosis not present

## 2022-03-25 DIAGNOSIS — D84821 Immunodeficiency due to drugs: Secondary | ICD-10-CM | POA: Diagnosis not present

## 2022-03-25 DIAGNOSIS — M810 Age-related osteoporosis without current pathological fracture: Secondary | ICD-10-CM | POA: Diagnosis not present

## 2022-03-25 DIAGNOSIS — D849 Immunodeficiency, unspecified: Secondary | ICD-10-CM | POA: Diagnosis not present

## 2022-03-25 DIAGNOSIS — R918 Other nonspecific abnormal finding of lung field: Secondary | ICD-10-CM | POA: Diagnosis not present

## 2022-03-25 DIAGNOSIS — Z942 Lung transplant status: Secondary | ICD-10-CM | POA: Diagnosis not present

## 2022-03-26 DIAGNOSIS — Z79899 Other long term (current) drug therapy: Secondary | ICD-10-CM | POA: Diagnosis not present

## 2022-03-26 DIAGNOSIS — B079 Viral wart, unspecified: Secondary | ICD-10-CM | POA: Diagnosis not present

## 2022-03-28 DIAGNOSIS — Z942 Lung transplant status: Secondary | ICD-10-CM | POA: Diagnosis not present

## 2022-03-28 DIAGNOSIS — Z86711 Personal history of pulmonary embolism: Secondary | ICD-10-CM | POA: Diagnosis not present

## 2022-03-28 DIAGNOSIS — Z86718 Personal history of other venous thrombosis and embolism: Secondary | ICD-10-CM | POA: Diagnosis not present

## 2022-03-28 DIAGNOSIS — I724 Aneurysm of artery of lower extremity: Secondary | ICD-10-CM | POA: Diagnosis not present

## 2022-04-03 DIAGNOSIS — B079 Viral wart, unspecified: Secondary | ICD-10-CM | POA: Diagnosis not present

## 2022-04-03 DIAGNOSIS — Z79899 Other long term (current) drug therapy: Secondary | ICD-10-CM | POA: Diagnosis not present

## 2022-04-08 DIAGNOSIS — J40 Bronchitis, not specified as acute or chronic: Secondary | ICD-10-CM | POA: Diagnosis not present

## 2022-04-08 DIAGNOSIS — Z794 Long term (current) use of insulin: Secondary | ICD-10-CM | POA: Diagnosis not present

## 2022-04-08 DIAGNOSIS — R058 Other specified cough: Secondary | ICD-10-CM | POA: Diagnosis not present

## 2022-04-08 DIAGNOSIS — T86818 Other complications of lung transplant: Secondary | ICD-10-CM | POA: Diagnosis not present

## 2022-04-08 DIAGNOSIS — N182 Chronic kidney disease, stage 2 (mild): Secondary | ICD-10-CM | POA: Diagnosis not present

## 2022-04-08 DIAGNOSIS — Z4824 Encounter for aftercare following lung transplant: Secondary | ICD-10-CM | POA: Diagnosis not present

## 2022-04-08 DIAGNOSIS — D631 Anemia in chronic kidney disease: Secondary | ICD-10-CM | POA: Diagnosis not present

## 2022-04-08 DIAGNOSIS — E440 Moderate protein-calorie malnutrition: Secondary | ICD-10-CM | POA: Diagnosis not present

## 2022-04-08 DIAGNOSIS — E1122 Type 2 diabetes mellitus with diabetic chronic kidney disease: Secondary | ICD-10-CM | POA: Diagnosis not present

## 2022-04-08 DIAGNOSIS — Z681 Body mass index (BMI) 19 or less, adult: Secondary | ICD-10-CM | POA: Diagnosis not present

## 2022-04-08 DIAGNOSIS — Z942 Lung transplant status: Secondary | ICD-10-CM | POA: Diagnosis not present

## 2022-04-08 DIAGNOSIS — Z86711 Personal history of pulmonary embolism: Secondary | ICD-10-CM | POA: Diagnosis not present

## 2022-04-08 DIAGNOSIS — Z79899 Other long term (current) drug therapy: Secondary | ICD-10-CM | POA: Diagnosis not present

## 2022-04-08 DIAGNOSIS — I129 Hypertensive chronic kidney disease with stage 1 through stage 4 chronic kidney disease, or unspecified chronic kidney disease: Secondary | ICD-10-CM | POA: Diagnosis not present

## 2022-04-08 DIAGNOSIS — K219 Gastro-esophageal reflux disease without esophagitis: Secondary | ICD-10-CM | POA: Diagnosis not present

## 2022-04-08 DIAGNOSIS — Z48298 Encounter for aftercare following other organ transplant: Secondary | ICD-10-CM | POA: Diagnosis not present

## 2022-04-08 DIAGNOSIS — Z8719 Personal history of other diseases of the digestive system: Secondary | ICD-10-CM | POA: Diagnosis not present

## 2022-05-14 DIAGNOSIS — D2339 Other benign neoplasm of skin of other parts of face: Secondary | ICD-10-CM | POA: Diagnosis not present

## 2022-05-14 DIAGNOSIS — J324 Chronic pansinusitis: Secondary | ICD-10-CM | POA: Diagnosis not present

## 2022-05-15 DIAGNOSIS — B079 Viral wart, unspecified: Secondary | ICD-10-CM | POA: Diagnosis not present

## 2022-05-15 DIAGNOSIS — Z79899 Other long term (current) drug therapy: Secondary | ICD-10-CM | POA: Diagnosis not present

## 2022-05-20 DIAGNOSIS — K8050 Calculus of bile duct without cholangitis or cholecystitis without obstruction: Secondary | ICD-10-CM | POA: Diagnosis not present

## 2022-05-20 DIAGNOSIS — K8070 Calculus of gallbladder and bile duct without cholecystitis without obstruction: Secondary | ICD-10-CM | POA: Diagnosis not present

## 2022-05-20 DIAGNOSIS — R1011 Right upper quadrant pain: Secondary | ICD-10-CM | POA: Diagnosis not present

## 2022-05-28 DIAGNOSIS — Z79899 Other long term (current) drug therapy: Secondary | ICD-10-CM | POA: Diagnosis not present

## 2022-05-28 DIAGNOSIS — L820 Inflamed seborrheic keratosis: Secondary | ICD-10-CM | POA: Diagnosis not present

## 2022-05-28 DIAGNOSIS — B079 Viral wart, unspecified: Secondary | ICD-10-CM | POA: Diagnosis not present

## 2022-06-12 DIAGNOSIS — L820 Inflamed seborrheic keratosis: Secondary | ICD-10-CM | POA: Diagnosis not present

## 2022-06-13 DIAGNOSIS — F32A Depression, unspecified: Secondary | ICD-10-CM | POA: Diagnosis not present

## 2022-06-13 DIAGNOSIS — Z942 Lung transplant status: Secondary | ICD-10-CM | POA: Diagnosis not present

## 2022-06-13 DIAGNOSIS — D508 Other iron deficiency anemias: Secondary | ICD-10-CM | POA: Diagnosis not present

## 2022-06-13 DIAGNOSIS — D702 Other drug-induced agranulocytosis: Secondary | ICD-10-CM | POA: Diagnosis not present

## 2022-06-13 DIAGNOSIS — J324 Chronic pansinusitis: Secondary | ICD-10-CM | POA: Diagnosis not present

## 2022-06-13 DIAGNOSIS — Z8619 Personal history of other infectious and parasitic diseases: Secondary | ICD-10-CM | POA: Diagnosis not present

## 2022-06-13 DIAGNOSIS — Z86718 Personal history of other venous thrombosis and embolism: Secondary | ICD-10-CM | POA: Diagnosis not present

## 2022-06-13 DIAGNOSIS — Z79899 Other long term (current) drug therapy: Secondary | ICD-10-CM | POA: Diagnosis not present

## 2022-06-13 DIAGNOSIS — B079 Viral wart, unspecified: Secondary | ICD-10-CM | POA: Diagnosis not present

## 2022-06-13 DIAGNOSIS — I10 Essential (primary) hypertension: Secondary | ICD-10-CM | POA: Diagnosis not present

## 2022-06-13 DIAGNOSIS — D84821 Immunodeficiency due to drugs: Secondary | ICD-10-CM | POA: Diagnosis not present

## 2022-06-13 DIAGNOSIS — Z9281 Personal history of extracorporeal membrane oxygenation (ECMO): Secondary | ICD-10-CM | POA: Diagnosis not present

## 2022-06-13 DIAGNOSIS — K8050 Calculus of bile duct without cholangitis or cholecystitis without obstruction: Secondary | ICD-10-CM | POA: Diagnosis not present

## 2022-06-13 DIAGNOSIS — Z5181 Encounter for therapeutic drug level monitoring: Secondary | ICD-10-CM | POA: Diagnosis not present

## 2022-06-13 DIAGNOSIS — D849 Immunodeficiency, unspecified: Secondary | ICD-10-CM | POA: Diagnosis not present

## 2022-06-13 DIAGNOSIS — Z86711 Personal history of pulmonary embolism: Secondary | ICD-10-CM | POA: Diagnosis not present

## 2022-06-13 DIAGNOSIS — Z01818 Encounter for other preprocedural examination: Secondary | ICD-10-CM | POA: Diagnosis not present

## 2022-06-13 DIAGNOSIS — T86811 Lung transplant failure: Secondary | ICD-10-CM | POA: Diagnosis not present

## 2022-06-20 DIAGNOSIS — K219 Gastro-esophageal reflux disease without esophagitis: Secondary | ICD-10-CM | POA: Diagnosis not present

## 2022-06-20 DIAGNOSIS — B079 Viral wart, unspecified: Secondary | ICD-10-CM | POA: Diagnosis not present

## 2022-06-20 DIAGNOSIS — D509 Iron deficiency anemia, unspecified: Secondary | ICD-10-CM | POA: Diagnosis not present

## 2022-06-20 DIAGNOSIS — Z86718 Personal history of other venous thrombosis and embolism: Secondary | ICD-10-CM | POA: Diagnosis not present

## 2022-06-20 DIAGNOSIS — Z86711 Personal history of pulmonary embolism: Secondary | ICD-10-CM | POA: Diagnosis not present

## 2022-06-20 DIAGNOSIS — F32A Depression, unspecified: Secondary | ICD-10-CM | POA: Diagnosis not present

## 2022-06-20 DIAGNOSIS — Z79899 Other long term (current) drug therapy: Secondary | ICD-10-CM | POA: Diagnosis not present

## 2022-06-20 DIAGNOSIS — J324 Chronic pansinusitis: Secondary | ICD-10-CM | POA: Diagnosis not present

## 2022-06-20 DIAGNOSIS — Z794 Long term (current) use of insulin: Secondary | ICD-10-CM | POA: Diagnosis not present

## 2022-06-20 DIAGNOSIS — Z8619 Personal history of other infectious and parasitic diseases: Secondary | ICD-10-CM | POA: Diagnosis not present

## 2022-06-20 DIAGNOSIS — K8010 Calculus of gallbladder with chronic cholecystitis without obstruction: Secondary | ICD-10-CM | POA: Diagnosis not present

## 2022-06-20 DIAGNOSIS — Z9281 Personal history of extracorporeal membrane oxygenation (ECMO): Secondary | ICD-10-CM | POA: Diagnosis not present

## 2022-06-20 DIAGNOSIS — K8050 Calculus of bile duct without cholangitis or cholecystitis without obstruction: Secondary | ICD-10-CM | POA: Diagnosis not present

## 2022-06-20 DIAGNOSIS — D702 Other drug-induced agranulocytosis: Secondary | ICD-10-CM | POA: Diagnosis not present

## 2022-06-20 DIAGNOSIS — D849 Immunodeficiency, unspecified: Secondary | ICD-10-CM | POA: Diagnosis not present

## 2022-06-20 DIAGNOSIS — E119 Type 2 diabetes mellitus without complications: Secondary | ICD-10-CM | POA: Diagnosis not present

## 2022-06-20 DIAGNOSIS — I10 Essential (primary) hypertension: Secondary | ICD-10-CM | POA: Diagnosis not present

## 2022-06-20 DIAGNOSIS — K8020 Calculus of gallbladder without cholecystitis without obstruction: Secondary | ICD-10-CM | POA: Diagnosis not present

## 2022-06-20 DIAGNOSIS — T86811 Lung transplant failure: Secondary | ICD-10-CM | POA: Diagnosis not present

## 2022-07-08 DIAGNOSIS — K8050 Calculus of bile duct without cholangitis or cholecystitis without obstruction: Secondary | ICD-10-CM | POA: Diagnosis not present

## 2022-07-17 DIAGNOSIS — Z79899 Other long term (current) drug therapy: Secondary | ICD-10-CM | POA: Diagnosis not present

## 2022-07-17 DIAGNOSIS — B079 Viral wart, unspecified: Secondary | ICD-10-CM | POA: Diagnosis not present

## 2022-07-31 DIAGNOSIS — B079 Viral wart, unspecified: Secondary | ICD-10-CM | POA: Diagnosis not present

## 2022-08-05 DIAGNOSIS — Z5181 Encounter for therapeutic drug level monitoring: Secondary | ICD-10-CM | POA: Diagnosis not present

## 2022-08-05 DIAGNOSIS — Z942 Lung transplant status: Secondary | ICD-10-CM | POA: Diagnosis not present

## 2022-08-05 DIAGNOSIS — D849 Immunodeficiency, unspecified: Secondary | ICD-10-CM | POA: Diagnosis not present

## 2022-08-05 DIAGNOSIS — E782 Mixed hyperlipidemia: Secondary | ICD-10-CM | POA: Diagnosis not present

## 2022-08-05 DIAGNOSIS — Z4824 Encounter for aftercare following lung transplant: Secondary | ICD-10-CM | POA: Diagnosis not present

## 2022-08-05 DIAGNOSIS — Z79899 Other long term (current) drug therapy: Secondary | ICD-10-CM | POA: Diagnosis not present

## 2022-08-05 DIAGNOSIS — T86818 Other complications of lung transplant: Secondary | ICD-10-CM | POA: Diagnosis not present

## 2022-08-21 DIAGNOSIS — J339 Nasal polyp, unspecified: Secondary | ICD-10-CM | POA: Diagnosis not present

## 2022-08-21 DIAGNOSIS — B079 Viral wart, unspecified: Secondary | ICD-10-CM | POA: Diagnosis not present

## 2022-08-21 DIAGNOSIS — D849 Immunodeficiency, unspecified: Secondary | ICD-10-CM | POA: Diagnosis not present

## 2022-08-21 DIAGNOSIS — Z79899 Other long term (current) drug therapy: Secondary | ICD-10-CM | POA: Diagnosis not present

## 2022-08-21 DIAGNOSIS — L820 Inflamed seborrheic keratosis: Secondary | ICD-10-CM | POA: Diagnosis not present

## 2022-10-30 DIAGNOSIS — K862 Cyst of pancreas: Secondary | ICD-10-CM | POA: Diagnosis not present

## 2022-10-30 DIAGNOSIS — T86818 Other complications of lung transplant: Secondary | ICD-10-CM | POA: Diagnosis not present

## 2022-10-30 DIAGNOSIS — Z5181 Encounter for therapeutic drug level monitoring: Secondary | ICD-10-CM | POA: Diagnosis not present

## 2022-10-30 DIAGNOSIS — D490 Neoplasm of unspecified behavior of digestive system: Secondary | ICD-10-CM | POA: Diagnosis not present

## 2022-10-30 DIAGNOSIS — Z79899 Other long term (current) drug therapy: Secondary | ICD-10-CM | POA: Diagnosis not present

## 2022-11-12 DIAGNOSIS — J324 Chronic pansinusitis: Secondary | ICD-10-CM | POA: Diagnosis not present

## 2022-11-17 DIAGNOSIS — E089 Diabetes mellitus due to underlying condition without complications: Secondary | ICD-10-CM | POA: Diagnosis not present

## 2022-12-01 DIAGNOSIS — Z7189 Other specified counseling: Secondary | ICD-10-CM | POA: Diagnosis not present

## 2022-12-01 DIAGNOSIS — Z4681 Encounter for fitting and adjustment of insulin pump: Secondary | ICD-10-CM | POA: Diagnosis not present

## 2022-12-01 DIAGNOSIS — E089 Diabetes mellitus due to underlying condition without complications: Secondary | ICD-10-CM | POA: Diagnosis not present

## 2023-01-06 DIAGNOSIS — Z5181 Encounter for therapeutic drug level monitoring: Secondary | ICD-10-CM | POA: Diagnosis not present

## 2023-01-06 DIAGNOSIS — R918 Other nonspecific abnormal finding of lung field: Secondary | ICD-10-CM | POA: Diagnosis not present

## 2023-01-06 DIAGNOSIS — T86818 Other complications of lung transplant: Secondary | ICD-10-CM | POA: Diagnosis not present

## 2023-01-06 DIAGNOSIS — D849 Immunodeficiency, unspecified: Secondary | ICD-10-CM | POA: Diagnosis not present

## 2023-01-06 DIAGNOSIS — Z942 Lung transplant status: Secondary | ICD-10-CM | POA: Diagnosis not present

## 2023-01-06 DIAGNOSIS — Z4824 Encounter for aftercare following lung transplant: Secondary | ICD-10-CM | POA: Diagnosis not present

## 2023-01-06 DIAGNOSIS — Z79899 Other long term (current) drug therapy: Secondary | ICD-10-CM | POA: Diagnosis not present

## 2023-01-06 DIAGNOSIS — Z23 Encounter for immunization: Secondary | ICD-10-CM | POA: Diagnosis not present

## 2023-02-27 DIAGNOSIS — R519 Headache, unspecified: Secondary | ICD-10-CM | POA: Diagnosis not present

## 2023-02-27 DIAGNOSIS — Z79899 Other long term (current) drug therapy: Secondary | ICD-10-CM | POA: Diagnosis not present

## 2023-06-23 DIAGNOSIS — G43909 Migraine, unspecified, not intractable, without status migrainosus: Secondary | ICD-10-CM | POA: Diagnosis not present

## 2023-06-23 DIAGNOSIS — Z942 Lung transplant status: Secondary | ICD-10-CM | POA: Diagnosis not present

## 2023-06-23 DIAGNOSIS — R059 Cough, unspecified: Secondary | ICD-10-CM | POA: Diagnosis not present

## 2023-06-23 DIAGNOSIS — Z79899 Other long term (current) drug therapy: Secondary | ICD-10-CM | POA: Diagnosis not present

## 2023-06-23 DIAGNOSIS — G43809 Other migraine, not intractable, without status migrainosus: Secondary | ICD-10-CM | POA: Diagnosis not present

## 2023-07-01 DIAGNOSIS — B965 Pseudomonas (aeruginosa) (mallei) (pseudomallei) as the cause of diseases classified elsewhere: Secondary | ICD-10-CM | POA: Diagnosis not present

## 2023-07-01 DIAGNOSIS — Z942 Lung transplant status: Secondary | ICD-10-CM | POA: Diagnosis not present

## 2023-07-01 DIAGNOSIS — T86819 Unspecified complication of lung transplant: Secondary | ICD-10-CM | POA: Diagnosis not present

## 2023-07-06 DIAGNOSIS — Z942 Lung transplant status: Secondary | ICD-10-CM | POA: Diagnosis not present

## 2023-07-06 DIAGNOSIS — T86819 Unspecified complication of lung transplant: Secondary | ICD-10-CM | POA: Diagnosis not present

## 2023-07-06 DIAGNOSIS — B965 Pseudomonas (aeruginosa) (mallei) (pseudomallei) as the cause of diseases classified elsewhere: Secondary | ICD-10-CM | POA: Diagnosis not present

## 2023-07-13 DIAGNOSIS — Z942 Lung transplant status: Secondary | ICD-10-CM | POA: Diagnosis not present

## 2023-07-13 DIAGNOSIS — T86819 Unspecified complication of lung transplant: Secondary | ICD-10-CM | POA: Diagnosis not present

## 2023-11-03 NOTE — Progress Notes (Signed)
 Allisha Harter                                          MRN: [id number]   11/03/2023   The [facility] Quality Team Specialist reviewed this patient medical record for the purposes of chart review for care gap closure. The following were reviewed: chart review for care gap closure-glycemic status assessment and kidney health evaluation for diabetes:eGFR  and uACR.    [facility]BCI Quality Team

## 2024-02-15 NOTE — Progress Notes (Signed)
 Stacy Oconnor                                          MRN: [id number]   02/15/2024   The [facility]BCI Quality Team Specialist reviewed this patient medical record for the purposes of chart review for care gap closure. The following were reviewed: chart review for care gap closure-glycemic status assessment.    VBCI Quality Team

## 2024-02-18 ENCOUNTER — Encounter: Payer: Self-pay | Admitting: *Deleted

## 2024-02-18 NOTE — Progress Notes (Signed)
 Marcus Sweet                                          MRN: [id number]   02/18/2024   The [facility]BCI Quality Team Specialist reviewed this patient medical record for the purposes of chart review for care gap closure. The following were reviewed: chart review for care gap closure-glycemic status assessment.    [facility]BCI Quality Team
